# Patient Record
Sex: Male | Born: 1945
Health system: Southern US, Community
[De-identification: ages and names within clinical notes are randomized; demographics above are authoritative.]

---

## 2016-11-28 ENCOUNTER — Other Ambulatory Visit (HOSPITAL_COMMUNITY): Payer: Self-pay

## 2016-11-28 ENCOUNTER — Inpatient Hospital Stay
Admission: RE | Admit: 2016-11-28 | Discharge: 2016-12-26 | Disposition: A | Discharge status: Home Health | Payer: Self-pay | Attending: Internal Medicine | Admitting: Internal Medicine

## 2016-11-28 DIAGNOSIS — J9690 Respiratory failure, unspecified, unspecified whether with hypoxia or hypercapnia: Secondary | ICD-10-CM

## 2016-11-28 DIAGNOSIS — T148XXA Other injury of unspecified body region, initial encounter: Secondary | ICD-10-CM

## 2016-11-28 DIAGNOSIS — L089 Local infection of the skin and subcutaneous tissue, unspecified: Secondary | ICD-10-CM

## 2016-11-28 DIAGNOSIS — K56609 Unspecified intestinal obstruction, unspecified as to partial versus complete obstruction: Secondary | ICD-10-CM

## 2016-11-28 DIAGNOSIS — T8130XA Disruption of wound, unspecified, initial encounter: Secondary | ICD-10-CM

## 2016-11-28 DIAGNOSIS — Z931 Gastrostomy status: Secondary | ICD-10-CM

## 2016-11-28 DIAGNOSIS — K567 Ileus, unspecified: Secondary | ICD-10-CM

## 2016-11-28 LAB — BLOOD GAS, ARTERIAL
Acid-Base Excess: 4.9 mmol/L — ABNORMAL HIGH (ref 0.0–2.0)
Bicarbonate: 30.1 mmol/L — ABNORMAL HIGH (ref 20.0–28.0)
Drawn by: 290171
FIO2: 40
O2 Saturation: 98.8 %
Patient temperature: 98.6
pCO2 arterial: 54.6 mmHg — ABNORMAL HIGH (ref 32.0–48.0)
pH, Arterial: 7.36 (ref 7.350–7.450)
pO2, Arterial: 113 mmHg — ABNORMAL HIGH (ref 83.0–108.0)

## 2016-11-28 MED ORDER — IOPAMIDOL (ISOVUE-300) INJECTION 61%
INTRAVENOUS | Status: AC
  Filled 2016-11-28: qty 50

## 2016-11-29 LAB — CBC
HEMATOCRIT: 31.3 % — AB (ref 39.0–52.0)
Hemoglobin: 9.8 g/dL — ABNORMAL LOW (ref 13.0–17.0)
MCH: 29.1 pg (ref 26.0–34.0)
MCHC: 31.3 g/dL (ref 30.0–36.0)
MCV: 92.9 fL (ref 78.0–100.0)
PLATELETS: 187 10*3/uL (ref 150–400)
RBC: 3.37 MIL/uL — AB (ref 4.22–5.81)
RDW: 16.7 % — AB (ref 11.5–15.5)
WBC: 8.2 10*3/uL (ref 4.0–10.5)

## 2016-11-29 LAB — PROTIME-INR
INR: 1.09
Prothrombin Time: 14.2 seconds (ref 11.4–15.2)

## 2016-11-29 LAB — BASIC METABOLIC PANEL
Anion gap: 10 (ref 5–15)
BUN: 40 mg/dL — AB (ref 6–20)
CO2: 29 mmol/L (ref 22–32)
Calcium: 8.3 mg/dL — ABNORMAL LOW (ref 8.9–10.3)
Chloride: 94 mmol/L — ABNORMAL LOW (ref 101–111)
Creatinine, Ser: 2.74 mg/dL — ABNORMAL HIGH (ref 0.61–1.24)
GFR, EST AFRICAN AMERICAN: 25 mL/min — AB (ref >60)
GFR, EST NON AFRICAN AMERICAN: 22 mL/min — AB (ref >60)
Glucose, Bld: 67 mg/dL (ref 65–99)
POTASSIUM: 3.4 mmol/L — AB (ref 3.5–5.1)
SODIUM: 133 mmol/L — AB (ref 135–145)

## 2016-11-29 LAB — VANCOMYCIN, TROUGH: VANCOMYCIN TR: 27 ug/mL — AB (ref 15–20)

## 2016-11-29 NOTE — Consult Note (Signed)
[facility] CONSULT NOTE    Date: 11/29/2016                  Patient Name:  Henry Parsons  MRN: [id number]  DOB: [birth year]  Age / Sex: 71 y.o., male         PCP: No primary care provider on file.                 Service Requesting Consult: Hospitalist                 Reason for Consult: Evaluation management of acute renal failure             History of Present Illness: Patient is a 71 y.o. male with a PMHx of Coronary artery disease status post recent 3 vessel CABG 10/15/16, tobacco abuse, carotid artery stenosis, hyperlipidemia, history of SVT, COPD, hypertension, abdominal aortic aneurysm who was admitted to [facility]  on 11/28/2016 for ongoing treatment of acute renal failure, acute respiratory failure.  The patient underwent CABG 3 on 10/15/16. Postoperatively he went into atrial fibrillation. He was reintubated on 10/21/2016 secondary to worsening hypoxemia. Unfortunately his respiratory failure persisted and he went back to the OR on 11/14/2016 for tracheostomy. He subsequently had a PEG tube placed as well. Patient was started on dialysis on 11/11/2016. He has been undergoing dialysis on Tuesday, Thursday, Saturday. His last dialysis was on Thursday prior to transfer here. He has a temporary right internal jugular catheter in place.   Medications: Outpatient medications: No prescriptions prior to admission.    Current medications: Current Facility-Administered Medications  Medication Dose Route Frequency Provider Last Rate Last Dose  . iopamidol (ISOVUE-300) 61 % injection               Allergies: No known drug allergies   Past Medical History: Hypertension    History of angioplasty    Carotid artery disease (*)    Dysrhythmia, cardiac    COPD (chronic obstructive pulmonary disease) (*)  USE INHALER DAILY. USE NEB AND INHALER PRN.   Emphysema of lung (*)    Back pain    Anxiety    Arthritis    Coronary artery  disease    Depression    GERD (gastroesophageal reflux disease)    Seizures (*)  "YEARS AGO"  HOH (hard of hearing)    Hyperlipidemia    AAA (abdominal aortic aneurysm) (*)    Callus    Fractures    Myocardial infarction (*)  DR. HARRIS, LAST SEEN 12/07/15 RECEIVED GOOD REPORT PER WIFE 01/16/16  Alcoholism (*)    Stroke (*)  NO DEFICITS PER WIFE 01/16/16  Colon polyp    Family hx colonic polyps  sister, brother  H/O colonoscopy 07/29/14   Cataract    Wears dentures    AKI (acute kidney injury) (*) 11/11/2016   Acute respiratory failure with hypoxia and hypercapnia (*) 11/11/2016   Sepsis (*) 11/11/2016      Past Surgical History: CORONARY ANGIOPLASTY   X 3 PROCEDURES, NO HEART STENT. RIGHT CAROTID STENT 09/14/13  [facility]  VENTRICULAR ABLATION SURGERY     CAROTID ENDARTERECTOMY 2010 Right Dr Hollace [place]  UPPER GASTROINTESTINAL ENDOSCOPY     CAROTID STENT 08/2013 Right   COLONOSCOPY 2006    COLONOSCOPY 07/29/14    TRANSANAL EXCISION RECTAL POLYP 09/13/2014  DR. STANLEY FULLER  HERNIA REPAIR   [facility]     Family History: Family history significant for hypertension in  his brother and sister. The patient's father had history of alcohol abuse. The patient's mother had uterine cancer.  Social History: Patient is married. He has prior history of heavy tobacco abuse. Used to work in [employer].  Review of Systems: Patient cannot offer as he has a tracheostomy in place  Vital Signs: Pulse 77 respirations 10 blood pressure 151/72 pulse ox 93%   Physical Exam: General: NAD, Laying in bed   Head: Normocephalic, atraumatic.  Eyes: Anicteric, EOMI  Nose: Mucous membranes moist, not inflammed, nonerythematous.  Throat: Oropharynx nonerythematous, no exudate appreciated.   Neck: Tracheostomy in place   Lungs:  Scattered rhonchi, normal respiratory effort .  Heart: S1, S2 no pericardial friction rub .  Abdomen:   Soft, nontender, nondistended, PEG tube in place   Extremities: Anasarca noted   Neurologic: Patient awake, alert, and will follow simple commands   Skin: Scattered ecchymosis noted     Lab results: Basic Metabolic Panel:  Recent Labs Lab 11/29/16 0710  NA 133*  K 3.4*  CL 94*  CO2 29  GLUCOSE 67  BUN 40*  CREATININE 2.74*  CALCIUM 8.3*    Liver Function Tests: No results for input(s): AST, ALT, ALKPHOS, BILITOT, PROT, ALBUMIN in the last 168 hours. No results for input(s): LIPASE, AMYLASE in the last 168 hours. No results for input(s): AMMONIA in the last 168 hours.  CBC:  Recent Labs Lab 11/29/16 0710  WBC 8.2  HGB 9.8*  HCT 31.3*  MCV 92.9  PLT 187    Cardiac Enzymes: No results for input(s): CKTOTAL, CKMB, CKMBINDEX, TROPONINI in the last 168 hours.  BNP: Invalid input(s): POCBNP  CBG: No results for input(s): GLUCAP in the last 168 hours.  Microbiology: No results found for this or any previous visit.  Coagulation Studies:  Recent Labs  11/29/16 0710  LABPROT 14.2  INR 1.09    Urinalysis: No results for input(s): COLORURINE, LABSPEC, PHURINE, GLUCOSEU, HGBUR, BILIRUBINUR, KETONESUR, PROTEINUR, UROBILINOGEN, NITRITE, LEUKOCYTESUR in the last 72 hours.  Invalid input(s): APPERANCEUR    Imaging: Dg Abd Portable 1v  Result Date: 11/28/2016 CLINICAL DATA:  Peg tube placement EXAM: PORTABLE ABDOMEN - 1 VIEW COMPARISON:  None. FINDINGS: Post sternotomy changes. Left basilar atelectasis or infiltrate. Small amount of contrast within collapsed stomach, more focal contrast collection at the gastroduodenal junction with contrast present in the duodenum. Some puddling of contrast at the gastroduodenal junction. IMPRESSION: 1. Apparent gastro jejunostomy tube with small amounts of contrast present in the stomach and duodenum. Globular contrast at the distal stomach presumably represents contrast around the balloon which appears positioned at the  gastroduodenal junction. There is some puddling of contrast peripheral to this, possibly around the balloon. Contrast injection with direct fluoroscopic observation would be helpful to clarify. Electronically Signed   By: Jasmine Pang M.D.   On: 11/28/2016 22:35      Assessment & Plan: Pt is a 71 y.o. male with a PMHx of Coronary artery disease status post recent 3 vessel CABG 10/15/16, tobacco abuse, carotid artery stenosis, hyperlipidemia, history of SVT, COPD, hypertension, abdominal aortic aneurysm who was admitted to [facility]  on 11/28/2016 for ongoing treatment of acute renal failure, acute respiratory failure.  1. Acute renal failure, likely multifactorial.  Baseline creatinine 1.1.  As above suspect that the patient's acute renal failure is multifactorial. He underwent recent CABG. We will plan for dialysis again tomorrow. Hopefully his renal function will improve over time. We will continue to use the temporary  right internal jugular catheter. If it appears that his dialysis will be prolonged we may need to consider placing a PermCath.  2.  Acute respiratory failure. Patient has a tracheostomy in place. He is not currently on the ventilator. Recommend [place]/critical care consultation.  3. Anemia unspecified. Hemoglobin currently 9.8. Hold off on Epogen at this time. Continue to monitor CBC.  4. Hyponatremia. Serum sodium mildly low at 133. This should be corrected with the next dialysis treatment. Continue to monitor serum electrolytes.  5. Thanks for consultation.

## 2016-11-30 LAB — CBC
HCT: 32.2 % — ABNORMAL LOW (ref 39.0–52.0)
Hemoglobin: 10.2 g/dL — ABNORMAL LOW (ref 13.0–17.0)
MCH: 29.7 pg (ref 26.0–34.0)
MCHC: 31.7 g/dL (ref 30.0–36.0)
MCV: 93.9 fL (ref 78.0–100.0)
PLATELETS: 194 10*3/uL (ref 150–400)
RBC: 3.43 MIL/uL — ABNORMAL LOW (ref 4.22–5.81)
RDW: 17 % — AB (ref 11.5–15.5)
WBC: 10.1 10*3/uL (ref 4.0–10.5)

## 2016-11-30 LAB — RENAL FUNCTION PANEL
Albumin: 2.4 g/dL — ABNORMAL LOW (ref 3.5–5.0)
Anion gap: 9 (ref 5–15)
BUN: 46 mg/dL — AB (ref 6–20)
CHLORIDE: 93 mmol/L — AB (ref 101–111)
CO2: 29 mmol/L (ref 22–32)
CREATININE: 3.01 mg/dL — AB (ref 0.61–1.24)
Calcium: 8.2 mg/dL — ABNORMAL LOW (ref 8.9–10.3)
GFR calc Af Amer: 22 mL/min — ABNORMAL LOW (ref >60)
GFR calc non Af Amer: 19 mL/min — ABNORMAL LOW (ref >60)
GLUCOSE: 109 mg/dL — AB (ref 65–99)
Phosphorus: 5.5 mg/dL — ABNORMAL HIGH (ref 2.5–4.6)
Potassium: 3.3 mmol/L — ABNORMAL LOW (ref 3.5–5.1)
Sodium: 131 mmol/L — ABNORMAL LOW (ref 135–145)

## 2016-11-30 LAB — CK TOTAL AND CKMB (NOT AT ARMC)
CK, MB: 14.2 ng/mL — ABNORMAL HIGH (ref 0.5–5.0)
Relative Index: INVALID (ref 0.0–2.5)
Total CK: 81 U/L (ref 49–397)

## 2016-11-30 LAB — TROPONIN I: Troponin I: 0.08 ng/mL (ref <0.03)

## 2016-12-01 LAB — CK TOTAL AND CKMB (NOT AT ARMC)
CK, MB: UNDETERMINED ng/mL (ref 0.5–5.0)
RELATIVE INDEX: UNDETERMINED (ref 0.0–2.5)
Total CK: UNDETERMINED U/L (ref 49–397)

## 2016-12-01 LAB — POTASSIUM: Potassium: 4.7 mmol/L (ref 3.5–5.1)

## 2016-12-01 LAB — TROPONIN I: TROPONIN I: 0.08 ng/mL — AB (ref <0.03)

## 2016-12-02 LAB — HEPATITIS B SURFACE ANTIGEN: Hepatitis B Surface Ag: NEGATIVE

## 2016-12-02 NOTE — Progress Notes (Signed)
[facility]    Date: 12/02/2016                  Patient Name:  Henry Parsons  MRN: [id number]  DOB: 08/26/1945  Age / Sex: 71 y.o., male          History of Present Illness: Patient is a 71 y.o. male with a PMHx of Coronary artery disease status post recent 3 vessel CABG 10/15/16, tobacco abuse, carotid artery stenosis, hyperlipidemia, history of SVT, COPD, hypertension, abdominal aortic aneurysm who was admitted to [facility]  on 11/28/2016 for ongoing treatment of acute renal failure, acute respiratory failure.  The patient underwent CABG 3 on 10/15/16. Postoperatively he went into atrial fibrillation. He was reintubated on 10/21/2016 secondary to worsening hypoxemia. Unfortunately his respiratory failure persisted and he went back to the OR on 11/14/2016 for tracheostomy. He subsequently had a PEG tube placed as well. Patient was started on dialysis on 11/11/2016.   Urine output 900 cc required last 24 hours Patient remains lethargic but able to follow simple commands. Wife is at bedside. Tube feeds are Continued via PEG tube Continues to have large amount of edema   Vital Signs: Temperature 97.4, pulse 71, respirations 18, blood pressure 106/59   Physical Exam: General: NAD, Laying in bed   Head: Normocephalic, atraumatic.  Eyes: Anicteric,   Nose: Mucous membranes moist, not inflammed, nonerythematous.  Throat: Oropharynx nonerythematous, no exudate appreciated.   Neck: Tracheostomy in place   Lungs:  Scattered rhonchi, normal respiratory effort .  Heart: S1, S2 no pericardial friction rub .  Abdomen:  Soft, nontender, nondistended, PEG tube in place   Extremities: Anasarca noted   Neurologic: Patient  follows simple commands   Skin: Scattered ecchymosis noted   Right IJ temporary dialysis catheter  Lab results: Basic Metabolic Panel:  Recent Labs Lab 11/29/16 0710 11/30/16 0640 12/01/16 0722  NA 133* 131*  --   K 3.4* 3.3* 4.7  CL 94*  93*  --   CO2 29 29  --   GLUCOSE 67 109*  --   BUN 40* 46*  --   CREATININE 2.74* 3.01*  --   CALCIUM 8.3* 8.2*  --   PHOS  --  5.5*  --     Liver Function Tests:  Recent Labs Lab 11/30/16 0640  ALBUMIN 2.4*   No results for input(s): LIPASE, AMYLASE in the last 168 hours. No results for input(s): AMMONIA in the last 168 hours.  CBC:  Recent Labs Lab 11/29/16 0710 11/30/16 0640  WBC 8.2 10.1  HGB 9.8* 10.2*  HCT 31.3* 32.2*  MCV 92.9 93.9  PLT 187 194    Cardiac Enzymes:  Recent Labs Lab 11/30/16 0748 12/01/16 0722  CKTOTAL 81 QUANTITY NOT SUFFICIENT, UNABLE TO PERFORM TEST  CKMB 14.2* QUANTITY NOT SUFFICIENT, UNABLE TO PERFORM TEST  TROPONINI 0.08* 0.08*    BNP: Invalid input(s): POCBNP  CBG: No results for input(s): GLUCAP in the last 168 hours.  Microbiology: No results found for this or any previous visit.  Coagulation Studies: No results for input(s): LABPROT, INR in the last 72 hours.  Urinalysis: No results for input(s): COLORURINE, LABSPEC, PHURINE, GLUCOSEU, HGBUR, BILIRUBINUR, KETONESUR, PROTEINUR, UROBILINOGEN, NITRITE, LEUKOCYTESUR in the last 72 hours.  Invalid input(s): APPERANCEUR    Imaging: No results found.   Assessment & Plan: Pt is a 71 y.o. male with a PMHx of Coronary artery disease status post recent 3 vessel CABG 10/15/16, tobacco abuse, carotid artery  stenosis, hyperlipidemia, history of SVT, COPD, hypertension, abdominal aortic aneurysm who was admitted to [facility]  on 11/28/2016 for ongoing treatment of acute renal failure, acute respiratory failure.  1. Acute renal failure, likely multifactorial.  Baseline creatinine 1.1.  As above suspect that the patient's acute renal failure is multifactorial. He underwent recent CABG.  We will plan for dialysis again tomorrow. Hopefully his renal function will improve over time. We will continue to use the temporary right internal jugular catheter. If it appears that his  dialysis will be prolonged we may need to consider placing a PermCath. Add Lasix drip and IV albumin  2.  Acute respiratory failure. Patient has a tracheostomy in place. He is not currently on the ventilator.   3. Anasarca/volume overload Urine output appears to be starting to improve. Fluid removal with dialysis, IV Lasix, IV albumin

## 2016-12-03 ENCOUNTER — Other Ambulatory Visit (HOSPITAL_COMMUNITY): Payer: Self-pay

## 2016-12-03 LAB — CBC
HCT: 27 % — ABNORMAL LOW (ref 39.0–52.0)
HCT: 27.6 % — ABNORMAL LOW (ref 39.0–52.0)
HEMATOCRIT: 25.9 % — AB (ref 39.0–52.0)
HEMOGLOBIN: 8.5 g/dL — AB (ref 13.0–17.0)
HEMOGLOBIN: 8.4 g/dL — AB (ref 13.0–17.0)
HEMOGLOBIN: 8.8 g/dL — AB (ref 13.0–17.0)
MCH: 29 pg (ref 26.0–34.0)
MCH: 29.8 pg (ref 26.0–34.0)
MCH: 29.6 pg (ref 26.0–34.0)
MCHC: 31.5 g/dL (ref 30.0–36.0)
MCHC: 32.4 g/dL (ref 30.0–36.0)
MCHC: 31.9 g/dL (ref 30.0–36.0)
MCV: 92.2 fL (ref 78.0–100.0)
MCV: 91.8 fL (ref 78.0–100.0)
MCV: 92.9 fL (ref 78.0–100.0)
Platelets: 226 10*3/uL (ref 150–400)
Platelets: 207 10*3/uL (ref 150–400)
Platelets: 223 10*3/uL (ref 150–400)
RBC: 2.93 MIL/uL — AB (ref 4.22–5.81)
RBC: 2.82 MIL/uL — ABNORMAL LOW (ref 4.22–5.81)
RBC: 2.97 MIL/uL — AB (ref 4.22–5.81)
RDW: 17.8 % — ABNORMAL HIGH (ref 11.5–15.5)
RDW: 18.1 % — ABNORMAL HIGH (ref 11.5–15.5)
RDW: 18.4 % — ABNORMAL HIGH (ref 11.5–15.5)
WBC: 10.6 10*3/uL — ABNORMAL HIGH (ref 4.0–10.5)
WBC: 9.8 10*3/uL (ref 4.0–10.5)
WBC: 9.8 10*3/uL (ref 4.0–10.5)

## 2016-12-03 LAB — BLOOD GAS, ARTERIAL
ACID-BASE EXCESS: 4.3 mmol/L — AB (ref 0.0–2.0)
Acid-Base Excess: 4.5 mmol/L — ABNORMAL HIGH (ref 0.0–2.0)
BICARBONATE: 29.4 mmol/L — AB (ref 20.0–28.0)
Bicarbonate: 29.2 mmol/L — ABNORMAL HIGH (ref 20.0–28.0)
FIO2: 40
FIO2: 0.4
O2 Saturation: 91.7 %
O2 Saturation: 97.7 %
PATIENT TEMPERATURE: 97.4
PATIENT TEMPERATURE: 98.6
PCO2 ART: 50 mmHg — AB (ref 32.0–48.0)
PEEP: 5 cmH2O
PH ART: 7.369 (ref 7.350–7.450)
RATE: 16 resp/min
VT: 500 mL
pCO2 arterial: 51.7 mmHg — ABNORMAL HIGH (ref 32.0–48.0)
pH, Arterial: 7.385 (ref 7.350–7.450)
pO2, Arterial: 59.2 mmHg — ABNORMAL LOW (ref 83.0–108.0)
pO2, Arterial: 96.2 mmHg (ref 83.0–108.0)

## 2016-12-03 LAB — BASIC METABOLIC PANEL
ANION GAP: 9 (ref 5–15)
BUN: 32 mg/dL — ABNORMAL HIGH (ref 6–20)
CALCIUM: 8.3 mg/dL — AB (ref 8.9–10.3)
CO2: 26 mmol/L (ref 22–32)
Chloride: 98 mmol/L — ABNORMAL LOW (ref 101–111)
Creatinine, Ser: 2.43 mg/dL — ABNORMAL HIGH (ref 0.61–1.24)
GFR, EST AFRICAN AMERICAN: 29 mL/min — AB (ref >60)
GFR, EST NON AFRICAN AMERICAN: 25 mL/min — AB (ref >60)
Glucose, Bld: 98 mg/dL (ref 65–99)
Potassium: 3.3 mmol/L — ABNORMAL LOW (ref 3.5–5.1)
Sodium: 133 mmol/L — ABNORMAL LOW (ref 135–145)

## 2016-12-03 LAB — RENAL FUNCTION PANEL
ALBUMIN: 2 g/dL — AB (ref 3.5–5.0)
ANION GAP: 8 (ref 5–15)
BUN: 50 mg/dL — ABNORMAL HIGH (ref 6–20)
CALCIUM: 8.2 mg/dL — AB (ref 8.9–10.3)
CO2: 29 mmol/L (ref 22–32)
Chloride: 98 mmol/L — ABNORMAL LOW (ref 101–111)
Creatinine, Ser: 3.18 mg/dL — ABNORMAL HIGH (ref 0.61–1.24)
GFR, EST AFRICAN AMERICAN: 21 mL/min — AB (ref >60)
GFR, EST NON AFRICAN AMERICAN: 18 mL/min — AB (ref >60)
Glucose, Bld: 99 mg/dL (ref 65–99)
PHOSPHORUS: 4.4 mg/dL (ref 2.5–4.6)
Potassium: 3.1 mmol/L — ABNORMAL LOW (ref 3.5–5.1)
SODIUM: 135 mmol/L (ref 135–145)

## 2016-12-03 LAB — HEPATITIS B CORE ANTIBODY, TOTAL: HEP B C TOTAL AB: NEGATIVE

## 2016-12-03 LAB — TROPONIN I: TROPONIN I: 0.13 ng/mL — AB (ref <0.03)

## 2016-12-03 LAB — HEPATITIS B SURFACE ANTIBODY,QUALITATIVE: HEP B S AB: REACTIVE

## 2016-12-04 NOTE — Progress Notes (Signed)
[facility]    Date: 12/04/2016                  Patient Name:  Henry Parsons  MRN: [id number]  DOB: [birth year]  Age / Sex: 71 y.o., male          History of Present Illness: Patient is a 71 y.o. male with a PMHx of Coronary artery disease status post recent 3 vessel CABG 10/15/16, tobacco abuse, carotid artery stenosis, hyperlipidemia, history of SVT, COPD, hypertension, abdominal aortic aneurysm who was admitted to [facility]  on 11/28/2016 for ongoing treatment of acute renal failure, acute respiratory failure.  The patient underwent CABG 3 on 10/15/16. Postoperatively he went into atrial fibrillation. He was reintubated on 10/21/2016 secondary to worsening hypoxemia. Unfortunately his respiratory failure persisted and he went back to the OR on 11/14/2016 for tracheostomy. He subsequently had a PEG tube placed as well. Patient was started on dialysis on 11/11/2016.   Urine output 875 cc  last 24 hours Patient remains lethargic today. Resting quietly/ Wife is at bedside. Tube feeds are Continued via PEG tube Continues to have large amount of edema BP dropped yesterday post HD; required 500 cc bolus Lasix drip is continued at 6 mg/hr   Vital Signs: Temperature 98.4, pulse 71, respirations 18, blood pressure 95/46   Physical Exam: General: NAD, Laying in bed   Head: Normocephalic, atraumatic.  Eyes: Anicteric,   Nose: Mucous membranes moist,   Throat: Oropharynx nonerythematous, no exudate appreciated.   Neck: Tracheostomy in place   Lungs:  Scattered rhonchi, normal respiratory effort .  Heart: S1, S2 no pericardial friction rub .  Abdomen:  Soft, nontender, nondistended, PEG tube in place   Extremities: Anasarca noted   Neurologic: Patient  Resting quietly  Skin: Scattered ecchymosis noted   Right IJ temporary dialysis catheter  Lab results: Basic Metabolic Panel:  Recent Labs Lab 11/30/16 0640 12/01/16 0722 12/03/16 0830 12/03/16 1807  NA  131*  --  135 133*  K 3.3* 4.7 3.1* 3.3*  CL 93*  --  98* 98*  CO2 29  --  29 26  GLUCOSE 109*  --  99 98  BUN 46*  --  50* 32*  CREATININE 3.01*  --  3.18* 2.43*  CALCIUM 8.2*  --  8.2* 8.3*  PHOS 5.5*  --  4.4  --     Liver Function Tests:  Recent Labs Lab 11/30/16 0640 12/03/16 0830  ALBUMIN 2.4* 2.0*   No results for input(s): LIPASE, AMYLASE in the last 168 hours. No results for input(s): AMMONIA in the last 168 hours.  CBC:  Recent Labs Lab 11/29/16 0710 11/30/16 0640 12/03/16 0830 12/03/16 1553 12/03/16 1807  WBC 8.2 10.1 10.6* 9.8 9.8  HGB 9.8* 10.2* 8.5* 8.4* 8.8*  HCT 31.3* 32.2* 27.0* 25.9* 27.6*  MCV 92.9 93.9 92.2 91.8 92.9  PLT 187 194 226 207 223    Cardiac Enzymes:  Recent Labs Lab 11/30/16 0748 12/01/16 0722 12/03/16 1807  CKTOTAL 81 QUANTITY NOT SUFFICIENT, UNABLE TO PERFORM TEST  --   CKMB 14.2* QUANTITY NOT SUFFICIENT, UNABLE TO PERFORM TEST  --   TROPONINI 0.08* 0.08* 0.13*    BNP: Invalid input(s): POCBNP  CBG: No results for input(s): GLUCAP in the last 168 hours.  Microbiology: No results found for this or any previous visit.  Coagulation Studies: No results for input(s): LABPROT, INR in the last 72 hours.  Urinalysis: No results for input(s): COLORURINE, LABSPEC, PHURINE,  GLUCOSEU, HGBUR, BILIRUBINUR, KETONESUR, PROTEINUR, UROBILINOGEN, NITRITE, LEUKOCYTESUR in the last 72 hours.  Invalid input(s): APPERANCEUR    Imaging: Dg Chest Port 1 View  Result Date: 12/03/2016 CLINICAL DATA:  71 year old male with shortness breath. Initial encounter. EXAM: PORTABLE CHEST 1 VIEW COMPARISON:  None. FINDINGS: Tracheostomy tube tip midline. Right central line tip proximal superior vena cava level. Post CABG.  Cardiomegaly. Diffuse airspace disease suggestive of pulmonary edema with pleural effusions. Basilar atelectasis suspected.  Infiltrate not excluded. Shoulder joint degenerative change greater on left. Calcified aorta. No gross  pneumothorax. IMPRESSION: Cardiomegaly with pulmonary edema and bilateral pleural effusions. Basilar atelectasis. Tracheostomy tube tip midline. Aortic atherosclerosis. Electronically Signed   By: Lacy DuverneySteven  Olson M.D.   On: 12/03/2016 07:34     Assessment & Plan: Pt is a 71 y.o. male with a PMHx of Coronary artery disease status post recent 3 vessel CABG 10/15/16, tobacco abuse, carotid artery stenosis, hyperlipidemia, history of SVT, COPD, hypertension, abdominal aortic aneurysm who was admitted to [facility]  on 11/28/2016 for ongoing treatment of acute renal failure, acute respiratory failure.  1. Acute renal failure, likely multifactorial.  Baseline creatinine 1.1.  As above suspect that the patient's acute renal failure is multifactorial. He underwent recent CABG.  Electrolytes are acceptable; UOP appears to have improved Continue lasix and albumin  - consider decreasing dose of Metoprolol as BP is low  2.  Acute respiratory failure. Patient has a tracheostomy in place.  Vent assisted; at present   3. Anasarca/volume overload Urine output appears to be starting to improve. Diuresis with IV Lasix, IV albumin  4. Hypokalemia Replace PRN

## 2016-12-05 LAB — C DIFFICILE QUICK SCREEN W PCR REFLEX
C DIFFICILE (CDIFF) TOXIN: NEGATIVE
C DIFFICLE (CDIFF) ANTIGEN: NEGATIVE
C Diff interpretation: NOT DETECTED

## 2016-12-06 LAB — CBC WITH DIFFERENTIAL/PLATELET
BASOS ABS: 0.1 10*3/uL (ref 0.0–0.1)
Basophils Relative: 1 %
EOS ABS: 0.1 10*3/uL (ref 0.0–0.7)
Eosinophils Relative: 1 %
HEMATOCRIT: 25.2 % — AB (ref 39.0–52.0)
Hemoglobin: 7.9 g/dL — ABNORMAL LOW (ref 13.0–17.0)
LYMPHS ABS: 1.2 10*3/uL (ref 0.7–4.0)
LYMPHS PCT: 15 %
MCH: 28.7 pg (ref 26.0–34.0)
MCHC: 31.3 g/dL (ref 30.0–36.0)
MCV: 91.6 fL (ref 78.0–100.0)
MONOS PCT: 11 %
Monocytes Absolute: 0.9 10*3/uL (ref 0.1–1.0)
NEUTROS ABS: 5.8 10*3/uL (ref 1.7–7.7)
Neutrophils Relative %: 72 %
Platelets: 249 10*3/uL (ref 150–400)
RBC: 2.75 MIL/uL — AB (ref 4.22–5.81)
RDW: 17.7 % — AB (ref 11.5–15.5)
WBC: 8.1 10*3/uL (ref 4.0–10.5)

## 2016-12-06 LAB — BASIC METABOLIC PANEL
ANION GAP: 12 (ref 5–15)
BUN: 58 mg/dL — AB (ref 6–20)
CALCIUM: 8.3 mg/dL — AB (ref 8.9–10.3)
CO2: 23 mmol/L (ref 22–32)
Chloride: 100 mmol/L — ABNORMAL LOW (ref 101–111)
Creatinine, Ser: 3.44 mg/dL — ABNORMAL HIGH (ref 0.61–1.24)
GFR calc Af Amer: 19 mL/min — ABNORMAL LOW (ref >60)
GFR, EST NON AFRICAN AMERICAN: 16 mL/min — AB (ref >60)
GLUCOSE: 90 mg/dL (ref 65–99)
POTASSIUM: 3 mmol/L — AB (ref 3.5–5.1)
SODIUM: 135 mmol/L (ref 135–145)

## 2016-12-06 NOTE — Progress Notes (Signed)
[facility]    Date: 12/06/2016                  Patient Name:  Henry Parsons  MRN: [id number]  DOB: 1945/11/06  Age / Sex: 71 y.o., male          History of Present Illness: Patient is a 71 y.o. male with a PMHx of Coronary artery disease status post recent 3 vessel CABG 10/15/16, tobacco abuse, carotid artery stenosis, hyperlipidemia, history of SVT, COPD, hypertension, abdominal aortic aneurysm who was admitted to [facility]  on 11/28/2016 for ongoing treatment of acute renal failure, acute respiratory failure.  The patient underwent CABG 3 on 10/15/16. Postoperatively he went into atrial fibrillation. He was reintubated on 10/21/2016 secondary to worsening hypoxemia. Unfortunately his respiratory failure persisted and he went back to the OR on 11/14/2016 for tracheostomy. He subsequently had a PEG tube placed as well. Patient was started on dialysis on 11/11/2016.   Urine output 1375 cc  last 24 hours Patient is alert today. Mouthing words. Able to follow commands. Wife is at bedside. Tube feeds are Continued via PEG tube Continues to have large amount of edema, but that appears to be improving Lasix drip is continued   Remains ventilator dependent. FiO2 28%, PEEP 5.   Vital Signs: Temperature Temperature 97.6, pulse 77, respirations 17, blood pressure 124/57   Physical Exam: General: NAD, Laying in bed   Head: Normocephalic, atraumatic.  Eyes: Anicteric,   Nose: Mucous membranes moist,   Throat: Oropharynx nonerythematous, no exudate appreciated.   Neck: Tracheostomy in place   Lungs:  Scattered rhonchi, Ventilator assisted   Heart: S1, S2 no pericardial friction rub .  Abdomen:  Soft, nontender, nondistended, PEG tube in place   Extremities: Anasarca noted   Neurologic: Patient  Resting quietly  Skin: Scattered ecchymosis noted   Right IJ temporary dialysis catheter  Lab results: Basic Metabolic Panel:  Recent Labs Lab 11/30/16 0640   12/03/16 0830 12/03/16 1807 12/06/16 0542  NA 131*  --  135 133* 135  K 3.3*  < > 3.1* 3.3* 3.0*  CL 93*  --  98* 98* 100*  CO2 29  --  29 26 23   GLUCOSE 109*  --  99 98 90  BUN 46*  --  50* 32* 58*  CREATININE 3.01*  --  3.18* 2.43* 3.44*  CALCIUM 8.2*  --  8.2* 8.3* 8.3*  PHOS 5.5*  --  4.4  --   --   < > = values in this interval not displayed.  Liver Function Tests:  Recent Labs Lab 11/30/16 0640 12/03/16 0830  ALBUMIN 2.4* 2.0*   No results for input(s): LIPASE, AMYLASE in the last 168 hours. No results for input(s): AMMONIA in the last 168 hours.  CBC:  Recent Labs Lab 11/30/16 0640 12/03/16 0830 12/03/16 1553 12/03/16 1807 12/06/16 1032  WBC 10.1 10.6* 9.8 9.8 8.1  NEUTROABS  --   --   --   --  5.8  HGB 10.2* 8.5* 8.4* 8.8* 7.9*  HCT 32.2* 27.0* 25.9* 27.6* 25.2*  MCV 93.9 92.2 91.8 92.9 91.6  PLT 194 226 207 223 249    Cardiac Enzymes:  Recent Labs Lab 11/30/16 0748 12/01/16 0722 12/03/16 1807  CKTOTAL 81 QUANTITY NOT SUFFICIENT, UNABLE TO PERFORM TEST  --   CKMB 14.2* QUANTITY NOT SUFFICIENT, UNABLE TO PERFORM TEST  --   TROPONINI 0.08* 0.08* 0.13*    BNP: Invalid input(s): POCBNP  CBG:  No results for input(s): GLUCAP in the last 168 hours.  Microbiology: Results for orders placed or performed during the hospital encounter of 11/28/16  C difficile quick scan w PCR reflex     Status: None   Collection Time: 12/05/16  8:45 AM  Result Value Ref Range Status   C Diff antigen NEGATIVE NEGATIVE Final   C Diff toxin NEGATIVE NEGATIVE Final   C Diff interpretation No C. difficile detected.  Final  Culture, respiratory (NON-Expectorated)     Status: None (Preliminary result)   Collection Time: 12/05/16  3:23 PM  Result Value Ref Range Status   Specimen Description TRACHEAL ASPIRATE  Final   Special Requests NONE  Final   Gram Stain   Final    RARE WBC PRESENT, PREDOMINANTLY MONONUCLEAR RARE BUDDING YEAST SEEN RARE GRAM NEGATIVE RODS     Culture FEW PSEUDOMONAS AERUGINOSA  Final   Report Status PENDING  Incomplete    Coagulation Studies: No results for input(s): LABPROT, INR in the last 72 hours.  Urinalysis: No results for input(s): COLORURINE, LABSPEC, PHURINE, GLUCOSEU, HGBUR, BILIRUBINUR, KETONESUR, PROTEINUR, UROBILINOGEN, NITRITE, LEUKOCYTESUR in the last 72 hours.  Invalid input(s): APPERANCEUR    Imaging: No results found.   Assessment & Plan: Pt is a 71 y.o. male with a PMHx of Coronary artery disease status post recent 3 vessel CABG 10/15/16, tobacco abuse, carotid artery stenosis, hyperlipidemia, history of SVT, COPD, hypertension, abdominal aortic aneurysm who was admitted to [facility]  on 11/28/2016 for ongoing treatment of acute renal failure, acute respiratory failure.  1. Acute renal failure, likely multifactorial.  Baseline creatinine 1.1.  As above suspect that the patient's acute renal failure is multifactorial. He underwent recent CABG.  Electrolytes are acceptable; UOP appears to have improved Continue lasix and albumin  - Continue to hold dialysis for now. Last dialysis was Monday July 16. Reassess after the weekend. 2.  Acute respiratory failure. Patient has a tracheostomy in place.  Vent assisted; at present   3. Anasarca/volume overload Urine output appears to be starting to improve. Diuresis with IV Lasix, IV albumin  4. Hypokalemia Replace PRN Add spironolactone 25 mg daily

## 2016-12-07 LAB — CULTURE, RESPIRATORY W GRAM STAIN

## 2016-12-07 LAB — CULTURE, RESPIRATORY

## 2016-12-09 LAB — CBC
HCT: 26.2 % — ABNORMAL LOW (ref 39.0–52.0)
HEMOGLOBIN: 8.2 g/dL — AB (ref 13.0–17.0)
MCH: 28 pg (ref 26.0–34.0)
MCHC: 31.3 g/dL (ref 30.0–36.0)
MCV: 89.4 fL (ref 78.0–100.0)
PLATELETS: 272 10*3/uL (ref 150–400)
RBC: 2.93 MIL/uL — AB (ref 4.22–5.81)
RDW: 17.6 % — ABNORMAL HIGH (ref 11.5–15.5)
WBC: 8.3 10*3/uL (ref 4.0–10.5)

## 2016-12-09 LAB — BASIC METABOLIC PANEL
Anion gap: 11 (ref 5–15)
BUN: 77 mg/dL — AB (ref 6–20)
CHLORIDE: 96 mmol/L — AB (ref 101–111)
CO2: 30 mmol/L (ref 22–32)
CREATININE: 3.91 mg/dL — AB (ref 0.61–1.24)
Calcium: 8.3 mg/dL — ABNORMAL LOW (ref 8.9–10.3)
GFR, EST AFRICAN AMERICAN: 16 mL/min — AB (ref >60)
GFR, EST NON AFRICAN AMERICAN: 14 mL/min — AB (ref >60)
Glucose, Bld: 100 mg/dL — ABNORMAL HIGH (ref 65–99)
POTASSIUM: 2.3 mmol/L — AB (ref 3.5–5.1)
SODIUM: 137 mmol/L (ref 135–145)

## 2016-12-09 NOTE — Progress Notes (Signed)
[facility]  ROUNDING NOTE   Subjective:  Patient seen at bedside. resting comfortably in bed. Tracheostomy in place. Renal function worsening As BUN is up to 77 with a creatinine of 3.9.  Objective:  Vital signs in last 24 hours:  Temperature 97.2 pulse 78 respirations 16 blood pressure 126/74 Physical Exam: General: No acute distress  Head: Normocephalic, atraumatic. Moist oral mucosal membranes  Eyes: Anicteric  Neck: Tracheostomy in place   Lungs:  Scattered rhonchi, unlabored breathing   Heart: S1S2 no rubs  Abdomen:  Soft, nontender, bowel sounds present  Extremities: 1+ peripheral edema.  Neurologic: Awake, alert, following commands  Skin: Scattered ecchymoses   Access: Right IJ temporary dialysis catheter     Basic Metabolic Panel:  Recent Labs Lab 12/03/16 0830 12/03/16 1807 12/06/16 0542 12/09/16 0544  NA 135 133* 135 137  K 3.1* 3.3* 3.0* 2.3*  CL 98* 98* 100* 96*  CO2 29 26 23 30   GLUCOSE 99 98 90 100*  BUN 50* 32* 58* 77*  CREATININE 3.18* 2.43* 3.44* 3.91*  CALCIUM 8.2* 8.3* 8.3* 8.3*  PHOS 4.4  --   --   --     Liver Function Tests:  Recent Labs Lab 12/03/16 0830  ALBUMIN 2.0*   No results for input(s): LIPASE, AMYLASE in the last 168 hours. No results for input(s): AMMONIA in the last 168 hours.  CBC:  Recent Labs Lab 12/03/16 0830 12/03/16 1553 12/03/16 1807 12/06/16 1032 12/09/16 0544  WBC 10.6* 9.8 9.8 8.1 8.3  NEUTROABS  --   --   --  5.8  --   HGB 8.5* 8.4* 8.8* 7.9* 8.2*  HCT 27.0* 25.9* 27.6* 25.2* 26.2*  MCV 92.2 91.8 92.9 91.6 89.4  PLT 226 207 223 249 272    Cardiac Enzymes:  Recent Labs Lab 12/03/16 1807  TROPONINI 0.13*    BNP: Invalid input(s): POCBNP  CBG: No results for input(s): GLUCAP in the last 168 hours.  Microbiology: Results for orders placed or performed during the hospital encounter of 11/28/16  C difficile quick scan w PCR reflex     Status: None   Collection Time: 12/05/16   8:45 AM  Result Value Ref Range Status   C Diff antigen NEGATIVE NEGATIVE Final   C Diff toxin NEGATIVE NEGATIVE Final   C Diff interpretation No C. difficile detected.  Final  Culture, respiratory (NON-Expectorated)     Status: None   Collection Time: 12/05/16  3:23 PM  Result Value Ref Range Status   Specimen Description TRACHEAL ASPIRATE  Final   Special Requests NONE  Final   Gram Stain   Final    RARE WBC PRESENT, PREDOMINANTLY MONONUCLEAR RARE BUDDING YEAST SEEN RARE GRAM NEGATIVE RODS    Culture   Final    FEW PSEUDOMONAS AERUGINOSA MULTI-DRUG RESISTANT ORGANISM RESULT CALLED TO, READ BACK BY AND VERIFIED WITH: S ODELL,RN AT 1015 12/07/16 BY L BENFIELD    Report Status 12/07/2016 FINAL  Final   Organism ID, Bacteria PSEUDOMONAS AERUGINOSA  Final      Susceptibility   Pseudomonas aeruginosa - MIC*    CEFTAZIDIME >=64 RESISTANT Resistant     CIPROFLOXACIN >=4 RESISTANT Resistant     GENTAMICIN 4 SENSITIVE Sensitive     IMIPENEM >=16 RESISTANT Resistant     CEFEPIME >=64 RESISTANT Resistant     * FEW PSEUDOMONAS AERUGINOSA    Coagulation Studies: No results for input(s): LABPROT, INR in the last 72 hours.  Urinalysis: No results for  input(s): COLORURINE, LABSPEC, PHURINE, GLUCOSEU, HGBUR, BILIRUBINUR, KETONESUR, PROTEINUR, UROBILINOGEN, NITRITE, LEUKOCYTESUR in the last 72 hours.  Invalid input(s): APPERANCEUR    Imaging: No results found.   Medications:       Assessment/ Plan:  71 y.o. male with a PMHx of Coronary artery disease status post recent 3 vessel CABG 10/15/16, tobacco abuse, carotid artery stenosis, hyperlipidemia, history of SVT, COPD, hypertension, abdominal aortic aneurysm who was admitted to [place]  on 11/28/2016 for ongoing treatment of acute renal failure, acute respiratory failure.  1. Acute renal failure, likely multifactorial.  CKD stage II  Baseline creatinine 1.1, EGFR 67..  As above suspect that the patient's acute renal  failure is multifactorial. He underwent recent CABG.  -  Renal parameters are worsening. However patient has good urine output. No urgent indication for dialysis however if azotemia worsens we may need to consider reinitiation of dialysis.  2.  Acute respiratory failure. Patient doing well with tracheostomy in place. Off the ventilator at the moment.  3. Anasarca/volume overload.  Good urine output noted. Continue Lasix.  4. Anemia of chronic kidney disease. Hemoglobin currently 8.2. Continue to monitor CBC. Consider transfusion for hemoglobin of 7 or less.   LOS: 0 Henry Parsons 7/23/20183:18 PM

## 2016-12-10 ENCOUNTER — Other Ambulatory Visit (HOSPITAL_COMMUNITY): Payer: Self-pay

## 2016-12-10 LAB — POTASSIUM
POTASSIUM: 2.5 mmol/L — AB (ref 3.5–5.1)
Potassium: 3.4 mmol/L — ABNORMAL LOW (ref 3.5–5.1)

## 2016-12-11 LAB — CBC
HCT: 24.8 % — ABNORMAL LOW (ref 39.0–52.0)
Hemoglobin: 7.8 g/dL — ABNORMAL LOW (ref 13.0–17.0)
MCH: 28.5 pg (ref 26.0–34.0)
MCHC: 31.5 g/dL (ref 30.0–36.0)
MCV: 90.5 fL (ref 78.0–100.0)
PLATELETS: 297 10*3/uL (ref 150–400)
RBC: 2.74 MIL/uL — ABNORMAL LOW (ref 4.22–5.81)
RDW: 18 % — AB (ref 11.5–15.5)
WBC: 9 10*3/uL (ref 4.0–10.5)

## 2016-12-11 LAB — RENAL FUNCTION PANEL
Albumin: 2.9 g/dL — ABNORMAL LOW (ref 3.5–5.0)
Anion gap: 13 (ref 5–15)
BUN: 80 mg/dL — AB (ref 6–20)
CALCIUM: 8.4 mg/dL — AB (ref 8.9–10.3)
CHLORIDE: 96 mmol/L — AB (ref 101–111)
CO2: 31 mmol/L (ref 22–32)
CREATININE: 3.74 mg/dL — AB (ref 0.61–1.24)
GFR calc Af Amer: 17 mL/min — ABNORMAL LOW (ref >60)
GFR, EST NON AFRICAN AMERICAN: 15 mL/min — AB (ref >60)
Glucose, Bld: 89 mg/dL (ref 65–99)
Phosphorus: 5.5 mg/dL — ABNORMAL HIGH (ref 2.5–4.6)
Potassium: 3.2 mmol/L — ABNORMAL LOW (ref 3.5–5.1)
SODIUM: 140 mmol/L (ref 135–145)

## 2016-12-11 NOTE — Progress Notes (Signed)
[facility]  ROUNDING NOTE   Subjective:  Patient still having good urine output. However azotemia did worsen as BUN was up to 80 with a creatinine of 3.74. therefore patient did undergo hemodialysis today.   Objective:  Vital signs in last 24 hours:  Temperature 97.2 pulse 78 respirations 16 blood pressure 126/74 Physical Exam: General: No acute distress  Head: Normocephalic, atraumatic. Moist oral mucosal membranes  Eyes: Anicteric  Neck: Tracheostomy in place   Lungs:  Scattered rhonchi, unlabored breathing   Heart: S1S2 no rubs  Abdomen:  Soft, nontender, bowel sounds present  Extremities: 1+ peripheral edema.  Neurologic: Awake, alert, following commands  Skin: Scattered ecchymoses   Access: Right IJ temporary dialysis catheter     Basic Metabolic Panel:  Recent Labs Lab 12/06/16 0542 12/09/16 0544 12/09/16 2325 12/10/16 1514 12/11/16 0652  NA 135 137  --   --  140  K 3.0* 2.3* 2.5* 3.4* 3.2*  CL 100* 96*  --   --  96*  CO2 23 30  --   --  31  GLUCOSE 90 100*  --   --  89  BUN 58* 77*  --   --  80*  CREATININE 3.44* 3.91*  --   --  3.74*  CALCIUM 8.3* 8.3*  --   --  8.4*  PHOS  --   --   --   --  5.5*    Liver Function Tests:  Recent Labs Lab 12/11/16 0652  ALBUMIN 2.9*   No results for input(s): LIPASE, AMYLASE in the last 168 hours. No results for input(s): AMMONIA in the last 168 hours.  CBC:  Recent Labs Lab 12/06/16 1032 12/09/16 0544 12/11/16 0652  WBC 8.1 8.3 9.0  NEUTROABS 5.8  --   --   HGB 7.9* 8.2* 7.8*  HCT 25.2* 26.2* 24.8*  MCV 91.6 89.4 90.5  PLT 249 272 297    Cardiac Enzymes: No results for input(s): CKTOTAL, CKMB, CKMBINDEX, TROPONINI in the last 168 hours.  BNP: Invalid input(s): POCBNP  CBG: No results for input(s): GLUCAP in the last 168 hours.  Microbiology: Results for orders placed or performed during the hospital encounter of 11/28/16  C difficile quick scan w PCR reflex     Status: None   Collection Time: 12/05/16  8:45 AM  Result Value Ref Range Status   C Diff antigen NEGATIVE NEGATIVE Final   C Diff toxin NEGATIVE NEGATIVE Final   C Diff interpretation No C. difficile detected.  Final  Culture, respiratory (NON-Expectorated)     Status: None   Collection Time: 12/05/16  3:23 PM  Result Value Ref Range Status   Specimen Description TRACHEAL ASPIRATE  Final   Special Requests NONE  Final   Gram Stain   Final    RARE WBC PRESENT, PREDOMINANTLY MONONUCLEAR RARE BUDDING YEAST SEEN RARE GRAM NEGATIVE RODS    Culture   Final    FEW PSEUDOMONAS AERUGINOSA MULTI-DRUG RESISTANT ORGANISM RESULT CALLED TO, READ BACK BY AND VERIFIED WITH: S ODELL,RN AT 1015 12/07/16 BY L BENFIELD    Report Status 12/07/2016 FINAL  Final   Organism ID, Bacteria PSEUDOMONAS AERUGINOSA  Final      Susceptibility   Pseudomonas aeruginosa - MIC*    CEFTAZIDIME >=64 RESISTANT Resistant     CIPROFLOXACIN >=4 RESISTANT Resistant     GENTAMICIN 4 SENSITIVE Sensitive     IMIPENEM >=16 RESISTANT Resistant     CEFEPIME >=64 RESISTANT Resistant     *  FEW PSEUDOMONAS AERUGINOSA    Coagulation Studies: No results for input(s): LABPROT, INR in the last 72 hours.  Urinalysis: No results for input(s): COLORURINE, LABSPEC, PHURINE, GLUCOSEU, HGBUR, BILIRUBINUR, KETONESUR, PROTEINUR, UROBILINOGEN, NITRITE, LEUKOCYTESUR in the last 72 hours.  Invalid input(s): APPERANCEUR    Imaging: No results found.   Medications:       Assessment/ Plan:  71 y.o. male with a PMHx of Coronary artery disease status post recent 3 vessel CABG 10/15/16, tobacco abuse, carotid artery stenosis, hyperlipidemia, history of SVT, COPD, hypertension, abdominal aortic aneurysm who was admitted to [place]  on 11/28/2016 for ongoing treatment of acute renal failure, acute respiratory failure.  1. Acute renal failure, likely multifactorial.  CKD stage II  Baseline creatinine 1.1, EGFR 67.  As above suspect that the  patient's acute renal failure is multifactorial. He underwent recent CABG.  -  Azotemia did worsen slightly has BUN was up to 80. Therefore patient underwent hemodialysis today. Patient still has good urine output. Therefore we will continue the patient on Lasix drip.  2.  Acute respiratory failure.  Tracheostomy appears to be functioning well.  3. Anasarca/volume overload.  Continue Lasix drip at 6 g per hour.  4. Anemia of chronic kidney disease. Hemoglobin down a bit to 7.8. Continue to monitor closely. Consider blood transfusion for hemoglobin of 7 or less.   LOS: 0 Lachelle Rissler 7/25/20183:37 PM

## 2016-12-13 LAB — RENAL FUNCTION PANEL
Albumin: 2.8 g/dL — ABNORMAL LOW (ref 3.5–5.0)
Anion gap: 12 (ref 5–15)
BUN: 68 mg/dL — AB (ref 6–20)
CHLORIDE: 94 mmol/L — AB (ref 101–111)
CO2: 29 mmol/L (ref 22–32)
Calcium: 8.3 mg/dL — ABNORMAL LOW (ref 8.9–10.3)
Creatinine, Ser: 3.01 mg/dL — ABNORMAL HIGH (ref 0.61–1.24)
GFR calc Af Amer: 22 mL/min — ABNORMAL LOW (ref >60)
GFR calc non Af Amer: 19 mL/min — ABNORMAL LOW (ref >60)
GLUCOSE: 87 mg/dL (ref 65–99)
Phosphorus: 5.8 mg/dL — ABNORMAL HIGH (ref 2.5–4.6)
Potassium: 3.4 mmol/L — ABNORMAL LOW (ref 3.5–5.1)
SODIUM: 135 mmol/L (ref 135–145)

## 2016-12-13 LAB — CBC
HCT: 25.5 % — ABNORMAL LOW (ref 39.0–52.0)
Hemoglobin: 8.2 g/dL — ABNORMAL LOW (ref 13.0–17.0)
MCH: 28.5 pg (ref 26.0–34.0)
MCHC: 32.2 g/dL (ref 30.0–36.0)
MCV: 88.5 fL (ref 78.0–100.0)
PLATELETS: 354 10*3/uL (ref 150–400)
RBC: 2.88 MIL/uL — AB (ref 4.22–5.81)
RDW: 17.6 % — AB (ref 11.5–15.5)
WBC: 9.9 10*3/uL (ref 4.0–10.5)

## 2016-12-13 NOTE — Progress Notes (Signed)
[facility]  ROUNDING NOTE   Subjective:  Patient continues to have good urine output. Foley catheter has been removed. Patient currently eating breakfast this a.m. He will be due for dialysis later today.   Objective:  Vital signs in last 24 hours:  Temperature 96.6 pulse 74 respirations 21 blood pressure 128/64 Physical Exam: General: No acute distress  Head: Normocephalic, atraumatic. Moist oral mucosal membranes  Eyes: Anicteric  Neck: Tracheostomy in place   Lungs:  Scattered rhonchi, unlabored breathing   Heart: S1S2 no rubs  Abdomen:  Soft, nontender, bowel sounds present  Extremities: trace peripheral edema.  Neurologic: Awake, alert, following commands  Skin: Scattered ecchymoses   Access: Right IJ temporary dialysis catheter     Basic Metabolic Panel:  Recent Labs Lab 12/09/16 0544 12/09/16 2325 12/10/16 1514 12/11/16 0652 12/13/16 0447  NA 137  --   --  140 135  K 2.3* 2.5* 3.4* 3.2* 3.4*  CL 96*  --   --  96* 94*  CO2 30  --   --  31 29  GLUCOSE 100*  --   --  89 87  BUN 77*  --   --  80* 68*  CREATININE 3.91*  --   --  3.74* 3.01*  CALCIUM 8.3*  --   --  8.4* 8.3*  PHOS  --   --   --  5.5* 5.8*    Liver Function Tests:  Recent Labs Lab 12/11/16 0652 12/13/16 0447  ALBUMIN 2.9* 2.8*   No results for input(s): LIPASE, AMYLASE in the last 168 hours. No results for input(s): AMMONIA in the last 168 hours.  CBC:  Recent Labs Lab 12/06/16 1032 12/09/16 0544 12/11/16 0652 12/13/16 0447  WBC 8.1 8.3 9.0 9.9  NEUTROABS 5.8  --   --   --   HGB 7.9* 8.2* 7.8* 8.2*  HCT 25.2* 26.2* 24.8* 25.5*  MCV 91.6 89.4 90.5 88.5  PLT 249 272 297 354    Cardiac Enzymes: No results for input(s): CKTOTAL, CKMB, CKMBINDEX, TROPONINI in the last 168 hours.  BNP: Invalid input(s): POCBNP  CBG: No results for input(s): GLUCAP in the last 168 hours.  Microbiology: Results for orders placed or performed during the hospital encounter of  11/28/16  C difficile quick scan w PCR reflex     Status: None   Collection Time: 12/05/16  8:45 AM  Result Value Ref Range Status   C Diff antigen NEGATIVE NEGATIVE Final   C Diff toxin NEGATIVE NEGATIVE Final   C Diff interpretation No C. difficile detected.  Final  Culture, respiratory (NON-Expectorated)     Status: None   Collection Time: 12/05/16  3:23 PM  Result Value Ref Range Status   Specimen Description TRACHEAL ASPIRATE  Final   Special Requests NONE  Final   Gram Stain   Final    RARE WBC PRESENT, PREDOMINANTLY MONONUCLEAR RARE BUDDING YEAST SEEN RARE GRAM NEGATIVE RODS    Culture   Final    FEW PSEUDOMONAS AERUGINOSA MULTI-DRUG RESISTANT ORGANISM RESULT CALLED TO, READ BACK BY AND VERIFIED WITH: S ODELL,RN AT 1015 12/07/16 BY L BENFIELD    Report Status 12/07/2016 FINAL  Final   Organism ID, Bacteria PSEUDOMONAS AERUGINOSA  Final      Susceptibility   Pseudomonas aeruginosa - MIC*    CEFTAZIDIME >=64 RESISTANT Resistant     CIPROFLOXACIN >=4 RESISTANT Resistant     GENTAMICIN 4 SENSITIVE Sensitive     IMIPENEM >=16 RESISTANT Resistant  CEFEPIME >=64 RESISTANT Resistant     * FEW PSEUDOMONAS AERUGINOSA    Coagulation Studies: No results for input(s): LABPROT, INR in the last 72 hours.  Urinalysis: No results for input(s): COLORURINE, LABSPEC, PHURINE, GLUCOSEU, HGBUR, BILIRUBINUR, KETONESUR, PROTEINUR, UROBILINOGEN, NITRITE, LEUKOCYTESUR in the last 72 hours.  Invalid input(s): APPERANCEUR    Imaging: No results found.   Medications:       Assessment/ Plan:  71 y.o. male with a PMHx of Coronary artery disease status post recent 3 vessel CABG 10/15/16, tobacco abuse, carotid artery stenosis, hyperlipidemia, history of SVT, COPD, hypertension, abdominal aortic aneurysm who was admitted to [place]  on 11/28/2016 for ongoing treatment of acute renal failure, acute respiratory failure.  1. Acute renal failure, likely multifactorial.  CKD stage  II  Baseline creatinine 1.1, EGFR 67..  As above suspect that the patient's acute renal failure is multifactorial. He underwent recent CABG.  - Creatinine currently 3.01. BUN 68. We will plan for hemodialysis today as he still has some ongoing azotemia. Unclear as to whether he'll need further dialysis after that.   2.  Acute respiratory failure. Passy-Muir valve in place. Appears to be doing well.  3. Anasarca/volume overload.  Currently off of Lasix drip. His edema has significantly improved.  4. Anemia of chronic kidney disease. Hemoglobin currently 8.2. This will need continued monitoring.   LOS: 0 Shenay Torti 7/27/20188:48 AM

## 2016-12-14 LAB — POTASSIUM
POTASSIUM: 3.9 mmol/L (ref 3.5–5.1)
Potassium: 3.3 mmol/L — ABNORMAL LOW (ref 3.5–5.1)

## 2016-12-15 ENCOUNTER — Other Ambulatory Visit (HOSPITAL_COMMUNITY): Payer: Self-pay

## 2016-12-15 LAB — COMPREHENSIVE METABOLIC PANEL
ALBUMIN: 3.1 g/dL — AB (ref 3.5–5.0)
ALK PHOS: 80 U/L (ref 38–126)
ALT: 23 U/L (ref 17–63)
ANION GAP: 14 (ref 5–15)
AST: 32 U/L (ref 15–41)
BILIRUBIN TOTAL: 1 mg/dL (ref 0.3–1.2)
BUN: 43 mg/dL — ABNORMAL HIGH (ref 6–20)
CALCIUM: 8.9 mg/dL (ref 8.9–10.3)
CO2: 26 mmol/L (ref 22–32)
CREATININE: 2.29 mg/dL — AB (ref 0.61–1.24)
Chloride: 97 mmol/L — ABNORMAL LOW (ref 101–111)
GFR calc Af Amer: 31 mL/min — ABNORMAL LOW (ref >60)
GFR calc non Af Amer: 27 mL/min — ABNORMAL LOW (ref >60)
Glucose, Bld: 96 mg/dL (ref 65–99)
Potassium: 4.3 mmol/L (ref 3.5–5.1)
SODIUM: 137 mmol/L (ref 135–145)
Total Protein: 6.7 g/dL (ref 6.5–8.1)

## 2016-12-15 LAB — CBC WITH DIFFERENTIAL/PLATELET
BASOS PCT: 1 %
Basophils Absolute: 0.1 10*3/uL (ref 0.0–0.1)
EOS ABS: 0.3 10*3/uL (ref 0.0–0.7)
Eosinophils Relative: 3 %
HCT: 29.8 % — ABNORMAL LOW (ref 39.0–52.0)
HEMOGLOBIN: 9.3 g/dL — AB (ref 13.0–17.0)
Lymphocytes Relative: 10 %
Lymphs Abs: 1.1 10*3/uL (ref 0.7–4.0)
MCH: 28.2 pg (ref 26.0–34.0)
MCHC: 31.2 g/dL (ref 30.0–36.0)
MCV: 90.3 fL (ref 78.0–100.0)
Monocytes Absolute: 0.9 10*3/uL (ref 0.1–1.0)
Monocytes Relative: 8 %
NEUTROS PCT: 78 %
Neutro Abs: 8.9 10*3/uL — ABNORMAL HIGH (ref 1.7–7.7)
PLATELETS: 453 10*3/uL — AB (ref 150–400)
RBC: 3.3 MIL/uL — AB (ref 4.22–5.81)
RDW: 17.9 % — ABNORMAL HIGH (ref 11.5–15.5)
WBC: 11.2 10*3/uL — AB (ref 4.0–10.5)

## 2016-12-16 LAB — CBC
HEMATOCRIT: 27.6 % — AB (ref 39.0–52.0)
Hemoglobin: 8.7 g/dL — ABNORMAL LOW (ref 13.0–17.0)
MCH: 28.5 pg (ref 26.0–34.0)
MCHC: 31.5 g/dL (ref 30.0–36.0)
MCV: 90.5 fL (ref 78.0–100.0)
Platelets: 463 10*3/uL — ABNORMAL HIGH (ref 150–400)
RBC: 3.05 MIL/uL — ABNORMAL LOW (ref 4.22–5.81)
RDW: 18.2 % — AB (ref 11.5–15.5)
WBC: 10.7 10*3/uL — ABNORMAL HIGH (ref 4.0–10.5)

## 2016-12-16 LAB — BASIC METABOLIC PANEL
Anion gap: 11 (ref 5–15)
BUN: 48 mg/dL — AB (ref 6–20)
CALCIUM: 8.7 mg/dL — AB (ref 8.9–10.3)
CO2: 27 mmol/L (ref 22–32)
CREATININE: 2.29 mg/dL — AB (ref 0.61–1.24)
Chloride: 100 mmol/L — ABNORMAL LOW (ref 101–111)
GFR calc Af Amer: 31 mL/min — ABNORMAL LOW (ref >60)
GFR, EST NON AFRICAN AMERICAN: 27 mL/min — AB (ref >60)
GLUCOSE: 128 mg/dL — AB (ref 65–99)
Potassium: 4.1 mmol/L (ref 3.5–5.1)
Sodium: 138 mmol/L (ref 135–145)

## 2016-12-16 NOTE — Progress Notes (Signed)
[facility]  ROUNDING NOTE   Subjective:  Renal function appears to be improving. BUN down to 43 with a creatinine of 2.2. Good urine output noted.   Objective:  Vital signs in last 24 hours:  Temperature 99.4 pulse 94 respirations 26 blood pressure 134/89  Physical Exam: General: No acute distress  Head: Normocephalic, atraumatic. Moist oral mucosal membranes  Eyes: Anicteric  Neck: Tracheostomy in place   Lungs:  Scattered rhonchi, Normal effort   Heart: S1S2 no rubs  Abdomen:  Soft, nontender, bowel sounds present  Extremities: trace peripheral edema.  Neurologic: Awake, alert, following commands  Skin: Scattered ecchymoses   Access: Right IJ temporary dialysis catheter     Basic Metabolic Panel:  Recent Labs Lab 12/11/16 0652 12/13/16 0447 12/14/16 1138 12/14/16 2239 12/15/16 1247  NA 140 135  --   --  137  K 3.2* 3.4* 3.3* 3.9 4.3  CL 96* 94*  --   --  97*  CO2 31 29  --   --  26  GLUCOSE 89 87  --   --  96  BUN 80* 68*  --   --  43*  CREATININE 3.74* 3.01*  --   --  2.29*  CALCIUM 8.4* 8.3*  --   --  8.9  PHOS 5.5* 5.8*  --   --   --     Liver Function Tests:  Recent Labs Lab 12/11/16 0652 12/13/16 0447 12/15/16 1247  AST  --   --  32  ALT  --   --  23  ALKPHOS  --   --  80  BILITOT  --   --  1.0  PROT  --   --  6.7  ALBUMIN 2.9* 2.8* 3.1*   No results for input(s): LIPASE, AMYLASE in the last 168 hours. No results for input(s): AMMONIA in the last 168 hours.  CBC:  Recent Labs Lab 12/11/16 0652 12/13/16 0447 12/15/16 1247  WBC 9.0 9.9 11.2*  NEUTROABS  --   --  8.9*  HGB 7.8* 8.2* 9.3*  HCT 24.8* 25.5* 29.8*  MCV 90.5 88.5 90.3  PLT 297 354 453*    Cardiac Enzymes: No results for input(s): CKTOTAL, CKMB, CKMBINDEX, TROPONINI in the last 168 hours.  BNP: Invalid input(s): POCBNP  CBG: No results for input(s): GLUCAP in the last 168 hours.  Microbiology: Results for orders placed or performed during the  hospital encounter of 11/28/16  C difficile quick scan w PCR reflex     Status: None   Collection Time: 12/05/16  8:45 AM  Result Value Ref Range Status   C Diff antigen NEGATIVE NEGATIVE Final   C Diff toxin NEGATIVE NEGATIVE Final   C Diff interpretation No C. difficile detected.  Final  Culture, respiratory (NON-Expectorated)     Status: None   Collection Time: 12/05/16  3:23 PM  Result Value Ref Range Status   Specimen Description TRACHEAL ASPIRATE  Final   Special Requests NONE  Final   Gram Stain   Final    RARE WBC PRESENT, PREDOMINANTLY MONONUCLEAR RARE BUDDING YEAST SEEN RARE GRAM NEGATIVE RODS    Culture   Final    FEW PSEUDOMONAS AERUGINOSA MULTI-DRUG RESISTANT ORGANISM RESULT CALLED TO, READ BACK BY AND VERIFIED WITH: S ODELL,RN AT 1015 12/07/16 BY L BENFIELD    Report Status 12/07/2016 FINAL  Final   Organism ID, Bacteria PSEUDOMONAS AERUGINOSA  Final      Susceptibility   Pseudomonas aeruginosa - MIC*  CEFTAZIDIME >=64 RESISTANT Resistant     CIPROFLOXACIN >=4 RESISTANT Resistant     GENTAMICIN 4 SENSITIVE Sensitive     IMIPENEM >=16 RESISTANT Resistant     CEFEPIME >=64 RESISTANT Resistant     * FEW PSEUDOMONAS AERUGINOSA  Aerobic Culture (superficial specimen)     Status: None (Preliminary result)   Collection Time: 12/15/16  6:10 PM  Result Value Ref Range Status   Specimen Description WOUND STERNUM  Final   Special Requests NONE  Final   Gram Stain   Final    ABUNDANT WBC PRESENT, PREDOMINANTLY PMN RARE GRAM NEGATIVE RODS    Culture PENDING  Incomplete   Report Status PENDING  Incomplete    Coagulation Studies: No results for input(s): LABPROT, INR in the last 72 hours.  Urinalysis: No results for input(s): COLORURINE, LABSPEC, PHURINE, GLUCOSEU, HGBUR, BILIRUBINUR, KETONESUR, PROTEINUR, UROBILINOGEN, NITRITE, LEUKOCYTESUR in the last 72 hours.  Invalid input(s): APPERANCEUR    Imaging: Ct Chest Wo Contrast  Result Date: 12/15/2016 CLINICAL  DATA:  Possible wound infection. EXAM: CT CHEST WITHOUT CONTRAST TECHNIQUE: Multidetector CT imaging of the chest was performed following the standard protocol without IV contrast. COMPARISON:  Chest radiograph 12/03/2016 FINDINGS: Cardiovascular: Intact tracheostomy tube. Enlarged heart. Small pericardial effusion. Calcific atherosclerotic disease of the coronary arteries and aorta. Mediastinum/Nodes: No enlarged mediastinal or axillary lymph nodes. Thyroid gland, trachea, and esophagus demonstrate no significant findings. Post recent median sternotomy and CABG with foci of gas and soft tissue thickening in the retrosternal space, as well as anterior to the sternum. No drainable fluid collection. Pockets of gas within the right upper chest and within the right breast. Few isolated pockets of gas are also seen within the left upper chest. Lungs/Pleura: Bilateral pleural effusions, small, with bilateral lower lobe subsegmental atelectasis versus airspace consolidation. Right upper lobe subpleural 4 mm nodule, image 44/161, sequence 4. 3 mm ground-glass nodule in the anterior portion of the right upper lobe, image 74/161, sequence 4. Upper Abdomen: Gastrostomy tube coils on itself within the gastric body. Partially visualized nonobstructing 4 mm calculus within the midpole of the right kidney. Residual contrast within the colon. Musculoskeletal: No chest wall mass or suspicious bone lesions identified. Post median sternotomy. 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 IMPRESSION: Post median sternotomy with foci of gas and soft tissue thickening in the retrosternal space, as well as anterior to the sternum. No drainable fluid collection. Pockets of gas are seen within the right and left upper chest wall and right breast. Bilateral small pleural effusions. Bilateral lower lobe atelectasis versus airspace consolidation. Two less than 4 mm right lung subpleural nodules. No follow-up needed if patient is low-risk  (and has no known or suspected primary neoplasm). Non-contrast chest CT can be considered in 12 months if patient is high-risk. This recommendation follows the consensus statement: Guidelines for Management of Incidental Pulmonary Nodules Detected on CT Images: From the Fleischner Society 2017; Radiology 2017; 284:228-243. Calcific atherosclerotic disease of the coronary arteries and aorta. Aortic Atherosclerosis (ICD10-I70.0). Electronically Signed   By: Fidela Salisbury M.D.   On: 12/15/2016 17:07     Medications:       Assessment/ Plan:  71 y.o. male with a PMHx of Coronary artery disease status post recent 3 vessel CABG 10/15/16, tobacco abuse, carotid artery stenosis, hyperlipidemia, history of SVT, COPD, hypertension, abdominal aortic aneurysm who was admitted to [place]  on 11/28/2016 for ongoing treatment of acute renal failure, acute respiratory failure.  1. Acute renal failure,  likely multifactorial.  CKD stage II  Baseline creatinine 1.1, EGFR 67..  As above suspect that the patient's acute renal failure is multifactorial. He underwent recent CABG.  - BUN and creatinine continued to trend down. BUN currently 43 with a creatinine of 2.2. Good urine output noted. No need for dialysis at the moment. Consider removing temporary dialysis catheter on Wednesday if kidney function continues to improve.  2.  Acute respiratory failure. Patient continues to do well with Tracheostomy in place.  3. Anasarca/volume overload.  Overall improved. Good urine output noted.  4. Anemia of chronic kidney disease. Hemoglobin up to 9.3. Hold off on Epogen.  LOS: 0 Henry Parsons 7/30/20182:23 PM

## 2016-12-17 ENCOUNTER — Other Ambulatory Visit (HOSPITAL_COMMUNITY): Payer: Self-pay

## 2016-12-18 ENCOUNTER — Other Ambulatory Visit (HOSPITAL_COMMUNITY): Payer: Self-pay

## 2016-12-18 LAB — BASIC METABOLIC PANEL
ANION GAP: 12 (ref 5–15)
BUN: 58 mg/dL — AB (ref 6–20)
CALCIUM: 9 mg/dL (ref 8.9–10.3)
CO2: 29 mmol/L (ref 22–32)
CREATININE: 2.14 mg/dL — AB (ref 0.61–1.24)
Chloride: 97 mmol/L — ABNORMAL LOW (ref 101–111)
GFR, EST AFRICAN AMERICAN: 34 mL/min — AB (ref >60)
GFR, EST NON AFRICAN AMERICAN: 29 mL/min — AB (ref >60)
GLUCOSE: 80 mg/dL (ref 65–99)
Potassium: 3.5 mmol/L (ref 3.5–5.1)
Sodium: 138 mmol/L (ref 135–145)

## 2016-12-18 LAB — CBC
HCT: 25.5 % — ABNORMAL LOW (ref 39.0–52.0)
Hemoglobin: 7.9 g/dL — ABNORMAL LOW (ref 13.0–17.0)
MCH: 27.6 pg (ref 26.0–34.0)
MCHC: 31 g/dL (ref 30.0–36.0)
MCV: 89.2 fL (ref 78.0–100.0)
PLATELETS: 468 10*3/uL — AB (ref 150–400)
RBC: 2.86 MIL/uL — ABNORMAL LOW (ref 4.22–5.81)
RDW: 17.7 % — AB (ref 11.5–15.5)
WBC: 14.1 10*3/uL — AB (ref 4.0–10.5)

## 2016-12-18 NOTE — Progress Notes (Signed)
[facility]  ROUNDING NOTE   Subjective:  Pt seen at bedside.  Became a bit short of breath and started on lasix.   Cr down but BUN higher.    Objective:  Vital signs in last 24 hours:  Temperature 95.9  pulse 89 respirations 18 blood pressure 119/105  Physical Exam: General: No acute distress  Head: Normocephalic, atraumatic. Moist oral mucosal membranes  Eyes: Anicteric  Neck: Tracheostomy in place   Lungs:  Scattered rhonchi, Normal effort   Heart: S1S2 no rubs  Abdomen:  Soft, nontender, bowel sounds present  Extremities: trace peripheral edema.  Neurologic: Awake, alert, following commands  Skin: Scattered ecchymoses   Access: Right IJ temporary dialysis catheter     Basic Metabolic Panel:  Recent Labs Lab 12/13/16 0447 12/14/16 1138 12/14/16 2239 12/15/16 1247 12/16/16 1620 12/18/16 0542  NA 135  --   --  137 138 138  K 3.4* 3.3* 3.9 4.3 4.1 3.5  CL 94*  --   --  97* 100* 97*  CO2 29  --   --  _0 GLUCOSE 87  --   --  96 128* 80  BUN 68*  --   --  43* 48* 58*  CREATININE 3.01*  --   --  2.29* 2.29* 2.14*  CALCIUM 8.3*  --   --  8.9 8.7* 9.0  PHOS 5.8*  --   --   --   --   --     Liver Function Tests:  Recent Labs Lab 12/13/16 0447 12/15/16 1247  AST  --  32  ALT  --  23  ALKPHOS  --  80  BILITOT  --  1.0  PROT  --  6.7  ALBUMIN 2.8* 3.1*   No results for input(s): LIPASE, AMYLASE in the last 168 hours. No results for input(s): AMMONIA in the last 168 hours.  CBC:  Recent Labs Lab 12/13/16 0447 12/15/16 1247 12/16/16 1620 12/18/16 0542  WBC 9.9 11.2* 10.7* 14.1*  NEUTROABS  --  8.9*  --   --   HGB 8.2* 9.3* 8.7* 7.9*  HCT 25.5* 29.8* 27.6* 25.5*  MCV 88.5 90.3 90.5 89.2  PLT 354 453* 463* 468*    Cardiac Enzymes: No results for input(s): CKTOTAL, CKMB, CKMBINDEX, TROPONINI in the last 168 hours.  BNP: Invalid input(s): POCBNP  CBG: No results for input(s): GLUCAP in the last 168  hours.  Microbiology: Results for orders placed or performed during the hospital encounter of 11/28/16  C difficile quick scan w PCR reflex     Status: None   Collection Time: 12/05/16  8:45 AM  Result Value Ref Range Status   C Diff antigen NEGATIVE NEGATIVE Final   C Diff toxin NEGATIVE NEGATIVE Final   C Diff interpretation No C. difficile detected.  Final  Culture, respiratory (NON-Expectorated)     Status: None   Collection Time: 12/05/16  3:23 PM  Result Value Ref Range Status   Specimen Description TRACHEAL ASPIRATE  Final   Special Requests NONE  Final   Gram Stain   Final    RARE WBC PRESENT, PREDOMINANTLY MONONUCLEAR RARE BUDDING YEAST SEEN RARE GRAM NEGATIVE RODS    Culture   Final    FEW PSEUDOMONAS AERUGINOSA MULTI-DRUG RESISTANT ORGANISM RESULT CALLED TO, READ BACK BY AND VERIFIED WITH: S ODELL,RN AT 1015 12/07/16 BY L BENFIELD    Report Status 12/07/2016 FINAL  Final   Organism ID, Bacteria PSEUDOMONAS AERUGINOSA  Final  Susceptibility   Pseudomonas aeruginosa - MIC*    CEFTAZIDIME >=64 RESISTANT Resistant     CIPROFLOXACIN >=4 RESISTANT Resistant     GENTAMICIN 4 SENSITIVE Sensitive     IMIPENEM >=16 RESISTANT Resistant     CEFEPIME >=64 RESISTANT Resistant     * FEW PSEUDOMONAS AERUGINOSA  Aerobic Culture (superficial specimen)     Status: None (Preliminary result)   Collection Time: 12/15/16  6:10 PM  Result Value Ref Range Status   Specimen Description WOUND STERNUM  Final   Special Requests NONE  Final   Gram Stain   Final    ABUNDANT WBC PRESENT, PREDOMINANTLY PMN RARE GRAM NEGATIVE RODS    Culture FEW PSEUDOMONAS AERUGINOSA  Final   Report Status PENDING  Incomplete   Organism ID, Bacteria PSEUDOMONAS AERUGINOSA  Final      Susceptibility   Pseudomonas aeruginosa - MIC*    CEFTAZIDIME >=64 RESISTANT Resistant     CIPROFLOXACIN >=4 RESISTANT Resistant     GENTAMICIN <=1 SENSITIVE Sensitive     IMIPENEM >=16 RESISTANT Resistant     CEFEPIME  >=64 RESISTANT Resistant     * FEW PSEUDOMONAS AERUGINOSA    Coagulation Studies: No results for input(s): LABPROT, INR in the last 72 hours.  Urinalysis: No results for input(s): COLORURINE, LABSPEC, PHURINE, GLUCOSEU, HGBUR, BILIRUBINUR, KETONESUR, PROTEINUR, UROBILINOGEN, NITRITE, LEUKOCYTESUR in the last 72 hours.  Invalid input(s): APPERANCEUR    Imaging: Dg Chest Port 1 View  Result Date: 12/17/2016 CLINICAL DATA:  Respiratory failure EXAM: PORTABLE CHEST 1 VIEW COMPARISON:  Chest x-ray of December 03, 2016 and chest CT scan of July 29th 2018. FINDINGS: The lungs are well-expanded. The interstitial markings are coarse. There is persistent increased density at both bases with obscuration of the hemidiaphragms. There small bilateral pleural effusions. The cardiac silhouette is enlarged. The central pulmonary vascularity is engorged but less conspicuous today. There are post CABG changes. There is calcification in the wall of the aortic arch. The tracheostomy tube tip lies at the level of the clavicular heads. The right internal jugular venous catheter tip projects over the proximal SVC. IMPRESSION: CHF with pulmonary interstitial edema superimposed upon chronic bronchitic changes. Bibasilar densities compatible with atelectasis or pneumonia and small bilateral pleural effusions not greatly changed from the previous study. Thoracic aortic atherosclerosis. Electronically Signed   By: David  Martinique M.D.   On: 12/17/2016 14:56   Dg Abd Portable 1v  Result Date: 12/18/2016 CLINICAL DATA:  71 year old male with a prior history of small bowel obstruction. Recent history of median sternotomy EXAM: PORTABLE ABDOMEN - 1 VIEW COMPARISON:  CT chest 12/15/2016, abdominal plain film 11/28/2016 FINDINGS: Gas within stomach and small bowel. Gas throughout the length of the colon. No abnormal distention. Formed stool within right colon, transverse colon, and rectum. Gastrojejunostomy tube again noted, although the  jejunostomy limb has withdrawn into the stomach lumen. No unexpected radiopaque foreign body. No unexpected calcification or soft tissue density. IMPRESSION: Nonobstructive bowel gas pattern. Moderate to large stool burden, may reflect constipation. Percutaneous gastrojejunostomy tube, with the jejunostomy limb withdrawn into the stomach. Electronically Signed   By: Corrie Mckusick D.O.   On: 12/18/2016 15:06     Medications:       Assessment/ Plan:  71 y.o. male with a PMHx of Coronary artery disease status post recent 3 vessel CABG 10/15/16, tobacco abuse, carotid artery stenosis, hyperlipidemia, history of SVT, COPD, hypertension, abdominal aortic aneurysm who was admitted to [facility]  on 11/28/2016 for ongoing  treatment of acute renal failure, acute respiratory failure.  1. Acute renal failure, likely multifactorial.  CKD stage II  Baseline creatinine 1.1, EGFR 67..  As above suspect that the patient's acute renal failure is multifactorial. He underwent recent CABG.  - BUN slightly up, Cr lower, switch lasix to to PO.  D/C temporary dialysis catheter.   2.  Acute respiratory failure. Did have some shortness of breath, was restarted on lasix, will convert to PO.  3. Anasarca/volume overload.  Convert IV lasix to PO.   4. Anemia of chronic kidney disease. hgb currently 7.9, continue to monitor, consider blood transfusion if hgb continues to drop.   LOS: 0 Claiborne Stroble 8/1/20183:38 PM

## 2016-12-19 LAB — AEROBIC CULTURE W GRAM STAIN (SUPERFICIAL SPECIMEN)

## 2016-12-19 LAB — AEROBIC CULTURE  (SUPERFICIAL SPECIMEN)

## 2016-12-20 NOTE — Progress Notes (Signed)
No new renal function testing available today, will return on Monday to see patient.

## 2016-12-21 LAB — BASIC METABOLIC PANEL
ANION GAP: 11 (ref 5–15)
BUN: 55 mg/dL — ABNORMAL HIGH (ref 6–20)
CALCIUM: 9 mg/dL (ref 8.9–10.3)
CO2: 30 mmol/L (ref 22–32)
Chloride: 95 mmol/L — ABNORMAL LOW (ref 101–111)
Creatinine, Ser: 2.03 mg/dL — ABNORMAL HIGH (ref 0.61–1.24)
GFR calc non Af Amer: 31 mL/min — ABNORMAL LOW (ref >60)
GFR, EST AFRICAN AMERICAN: 36 mL/min — AB (ref >60)
Glucose, Bld: 97 mg/dL (ref 65–99)
Potassium: 3 mmol/L — ABNORMAL LOW (ref 3.5–5.1)
SODIUM: 136 mmol/L (ref 135–145)

## 2016-12-21 LAB — CBC
HCT: 21.1 % — ABNORMAL LOW (ref 39.0–52.0)
HEMOGLOBIN: 6.6 g/dL — AB (ref 13.0–17.0)
MCH: 28 pg (ref 26.0–34.0)
MCHC: 31.3 g/dL (ref 30.0–36.0)
MCV: 89.4 fL (ref 78.0–100.0)
PLATELETS: 368 10*3/uL (ref 150–400)
RBC: 2.36 MIL/uL — AB (ref 4.22–5.81)
RDW: 18 % — ABNORMAL HIGH (ref 11.5–15.5)
WBC: 9.5 10*3/uL (ref 4.0–10.5)

## 2016-12-23 NOTE — Progress Notes (Signed)
[facility]  ROUNDING NOTE   Subjective:   Wife at bedside.   Patient with some shortness of breath and pleuritic chest pain.   Getting IV albumin    Objective:  Vital signs in last 24 hours:  T 97.9 p 81 bp 115/60 SPO2 93%  Physical Exam: General: No acute distress  Head: Normocephalic, atraumatic. Moist oral mucosal membranes  Eyes: Anicteric  Neck: Tracheostomy    Lungs:  clear   Heart: S1S2 no rubs  Abdomen:  Soft, nontender, bowel sounds present  Extremities: peripheral edema.  Neurologic: Awake, alert, following commands  Skin: Scattered ecchymoses        Basic Metabolic Panel:  Recent Labs Lab 12/18/16 0542 12/21/16 0823  NA 138 136  K 3.5 3.0*  CL 97* 95*  CO2 29 30  GLUCOSE 80 97  BUN 58* 55*  CREATININE 2.14* 2.03*  CALCIUM 9.0 9.0    Liver Function Tests: No results for input(s): AST, ALT, ALKPHOS, BILITOT, PROT, ALBUMIN in the last 168 hours. No results for input(s): LIPASE, AMYLASE in the last 168 hours. No results for input(s): AMMONIA in the last 168 hours.  CBC:  Recent Labs Lab 12/18/16 0542 12/21/16 0823  WBC 14.1* 9.5  HGB 7.9* 6.6*  HCT 25.5* 21.1*  MCV 89.2 89.4  PLT 468* 368    Cardiac Enzymes: No results for input(s): CKTOTAL, CKMB, CKMBINDEX, TROPONINI in the last 168 hours.  BNP: Invalid input(s): POCBNP  CBG: No results for input(s): GLUCAP in the last 168 hours.  Microbiology: Results for orders placed or performed during the hospital encounter of 11/28/16  C difficile quick scan w PCR reflex     Status: None   Collection Time: 12/05/16  8:45 AM  Result Value Ref Range Status   C Diff antigen NEGATIVE NEGATIVE Final   C Diff toxin NEGATIVE NEGATIVE Final   C Diff interpretation No C. difficile detected.  Final  Culture, respiratory (NON-Expectorated)     Status: None   Collection Time: 12/05/16  3:23 PM  Result Value Ref Range Status   Specimen Description TRACHEAL ASPIRATE  Final   Special  Requests NONE  Final   Gram Stain   Final    RARE WBC PRESENT, PREDOMINANTLY MONONUCLEAR RARE BUDDING YEAST SEEN RARE GRAM NEGATIVE RODS    Culture   Final    FEW PSEUDOMONAS AERUGINOSA MULTI-DRUG RESISTANT ORGANISM RESULT CALLED TO, READ BACK BY AND VERIFIED WITH: S ODELL,RN AT 1015 12/07/16 BY L BENFIELD    Report Status 12/07/2016 FINAL  Final   Organism ID, Bacteria PSEUDOMONAS AERUGINOSA  Final      Susceptibility   Pseudomonas aeruginosa - MIC*    CEFTAZIDIME >=64 RESISTANT Resistant     CIPROFLOXACIN >=4 RESISTANT Resistant     GENTAMICIN 4 SENSITIVE Sensitive     IMIPENEM >=16 RESISTANT Resistant     CEFEPIME >=64 RESISTANT Resistant     * FEW PSEUDOMONAS AERUGINOSA  Aerobic Culture (superficial specimen)     Status: None   Collection Time: 12/15/16  6:10 PM  Result Value Ref Range Status   Specimen Description WOUND STERNUM  Final   Special Requests NONE  Final   Gram Stain   Final    ABUNDANT WBC PRESENT, PREDOMINANTLY PMN RARE GRAM NEGATIVE RODS    Culture   Final    FEW PSEUDOMONAS AERUGINOSA MULTI-DRUG RESISTANT ORGANISM    Report Status 12/19/2016 FINAL  Final   Organism ID, Bacteria PSEUDOMONAS AERUGINOSA  Final  Susceptibility   Pseudomonas aeruginosa - MIC*    CEFTAZIDIME >=64 RESISTANT Resistant     CIPROFLOXACIN >=4 RESISTANT Resistant     GENTAMICIN <=1 SENSITIVE Sensitive     IMIPENEM >=16 RESISTANT Resistant     CEFEPIME >=64 RESISTANT Resistant     * FEW PSEUDOMONAS AERUGINOSA    Coagulation Studies: No results for input(s): LABPROT, INR in the last 72 hours.  Urinalysis: No results for input(s): COLORURINE, LABSPEC, PHURINE, GLUCOSEU, HGBUR, BILIRUBINUR, KETONESUR, PROTEINUR, UROBILINOGEN, NITRITE, LEUKOCYTESUR in the last 72 hours.  Invalid input(s): APPERANCEUR    Imaging: No results found.   Medications:       Assessment/ Plan:  71 y.o. male with a PMHx of Coronary artery disease status post recent 3 vessel CABG 10/15/16,  tobacco abuse, carotid artery stenosis, hyperlipidemia, history of SVT, COPD, hypertension, abdominal aortic aneurysm who was admitted to [facility]  on 11/28/2016 for ongoing treatment of acute renal failure, acute respiratory failure.  1. Acute renal failure: acute renal failure is multifactorial - no indication for dialysis. Continue to monitor for daily need - Renally dose all medications  2.  Acute respiratory failure: doing well - prednisone - supportive care  3. Hypertension/Anasarca/volume overload.  Volume status acceptable - furosemide and spironolactone  4. Anemia of chronic kidney disease: hemoglobin 6.6 - pending new labs.  - iron supplements - Aranesp   LOS: 0 Henry Parsons 8/6/20184:55 PM

## 2016-12-24 LAB — BASIC METABOLIC PANEL
Anion gap: 14 (ref 5–15)
BUN: 44 mg/dL — ABNORMAL HIGH (ref 6–20)
CO2: 27 mmol/L (ref 22–32)
Calcium: 8.9 mg/dL (ref 8.9–10.3)
Chloride: 99 mmol/L — ABNORMAL LOW (ref 101–111)
Creatinine, Ser: 1.69 mg/dL — ABNORMAL HIGH (ref 0.61–1.24)
GFR calc Af Amer: 45 mL/min — ABNORMAL LOW (ref >60)
GFR calc non Af Amer: 38 mL/min — ABNORMAL LOW (ref >60)
GLUCOSE: 98 mg/dL (ref 65–99)
POTASSIUM: 3.5 mmol/L (ref 3.5–5.1)
Sodium: 140 mmol/L (ref 135–145)

## 2016-12-24 LAB — CBC WITH DIFFERENTIAL/PLATELET
Basophils Absolute: 0.1 10*3/uL (ref 0.0–0.1)
Basophils Relative: 1 %
Eosinophils Absolute: 0.5 10*3/uL (ref 0.0–0.7)
Eosinophils Relative: 4 %
HEMATOCRIT: 22.9 % — AB (ref 39.0–52.0)
Hemoglobin: 7 g/dL — ABNORMAL LOW (ref 13.0–17.0)
LYMPHS ABS: 2.3 10*3/uL (ref 0.7–4.0)
LYMPHS PCT: 16 %
MCH: 27.3 pg (ref 26.0–34.0)
MCHC: 30.6 g/dL (ref 30.0–36.0)
MCV: 89.5 fL (ref 78.0–100.0)
MONO ABS: 1.6 10*3/uL — AB (ref 0.1–1.0)
MONOS PCT: 11 %
NEUTROS ABS: 10.1 10*3/uL — AB (ref 1.7–7.7)
Neutrophils Relative %: 68 %
Platelets: 334 10*3/uL (ref 150–400)
RBC: 2.56 MIL/uL — ABNORMAL LOW (ref 4.22–5.81)
RDW: 18.3 % — AB (ref 11.5–15.5)
WBC: 14.6 10*3/uL — ABNORMAL HIGH (ref 4.0–10.5)

## 2016-12-25 LAB — RENAL FUNCTION PANEL
ALBUMIN: 3.2 g/dL — AB (ref 3.5–5.0)
Anion gap: 12 (ref 5–15)
BUN: 43 mg/dL — AB (ref 6–20)
CALCIUM: 9.1 mg/dL (ref 8.9–10.3)
CHLORIDE: 98 mmol/L — AB (ref 101–111)
CO2: 27 mmol/L (ref 22–32)
CREATININE: 1.62 mg/dL — AB (ref 0.61–1.24)
GFR, EST AFRICAN AMERICAN: 47 mL/min — AB (ref >60)
GFR, EST NON AFRICAN AMERICAN: 40 mL/min — AB (ref >60)
Glucose, Bld: 91 mg/dL (ref 65–99)
Phosphorus: 4.4 mg/dL (ref 2.5–4.6)
Potassium: 3.6 mmol/L (ref 3.5–5.1)
SODIUM: 137 mmol/L (ref 135–145)

## 2016-12-25 LAB — CBC
HCT: 24 % — ABNORMAL LOW (ref 39.0–52.0)
Hemoglobin: 7.4 g/dL — ABNORMAL LOW (ref 13.0–17.0)
MCH: 27.4 pg (ref 26.0–34.0)
MCHC: 30.8 g/dL (ref 30.0–36.0)
MCV: 88.9 fL (ref 78.0–100.0)
PLATELETS: 343 10*3/uL (ref 150–400)
RBC: 2.7 MIL/uL — AB (ref 4.22–5.81)
RDW: 18.5 % — AB (ref 11.5–15.5)
WBC: 11.9 10*3/uL — AB (ref 4.0–10.5)

## 2016-12-26 LAB — RENAL FUNCTION PANEL
ALBUMIN: 3.3 g/dL — AB (ref 3.5–5.0)
Anion gap: 12 (ref 5–15)
BUN: 45 mg/dL — ABNORMAL HIGH (ref 6–20)
CO2: 28 mmol/L (ref 22–32)
CREATININE: 1.66 mg/dL — AB (ref 0.61–1.24)
Calcium: 9.2 mg/dL (ref 8.9–10.3)
Chloride: 98 mmol/L — ABNORMAL LOW (ref 101–111)
GFR calc Af Amer: 46 mL/min — ABNORMAL LOW (ref >60)
GFR, EST NON AFRICAN AMERICAN: 39 mL/min — AB (ref >60)
GLUCOSE: 88 mg/dL (ref 65–99)
Phosphorus: 4.4 mg/dL (ref 2.5–4.6)
Potassium: 4.2 mmol/L (ref 3.5–5.1)
SODIUM: 138 mmol/L (ref 135–145)

## 2016-12-26 LAB — CBC
HCT: 23.8 % — ABNORMAL LOW (ref 39.0–52.0)
HEMOGLOBIN: 7.5 g/dL — AB (ref 13.0–17.0)
MCH: 28.3 pg (ref 26.0–34.0)
MCHC: 31.5 g/dL (ref 30.0–36.0)
MCV: 89.8 fL (ref 78.0–100.0)
PLATELETS: 319 10*3/uL (ref 150–400)
RBC: 2.65 MIL/uL — AB (ref 4.22–5.81)
RDW: 18.7 % — ABNORMAL HIGH (ref 11.5–15.5)
WBC: 10.8 10*3/uL — ABNORMAL HIGH (ref 4.0–10.5)

## 2017-06-20 DEATH — deceased

## 2018-01-28 IMAGING — CT CT CHEST W/O CM
2 of 4 series · 14 of 36 positions shown, 17 images · non-contrast
Comparison: Chest radiograph 12/03/2016

CLINICAL DATA: Possible wound infection.

EXAM:
CT CHEST WITHOUT CONTRAST
TECHNIQUE: Multidetector CT imaging of the chest was performed following the
standard protocol without IV contrast.

[Series 3: chest wo · axial · 0.77mm/px · z∈[+1350,+1620]mm · 11 of 161 slices shown, 14 images]
[im 13/161  mediastinal]
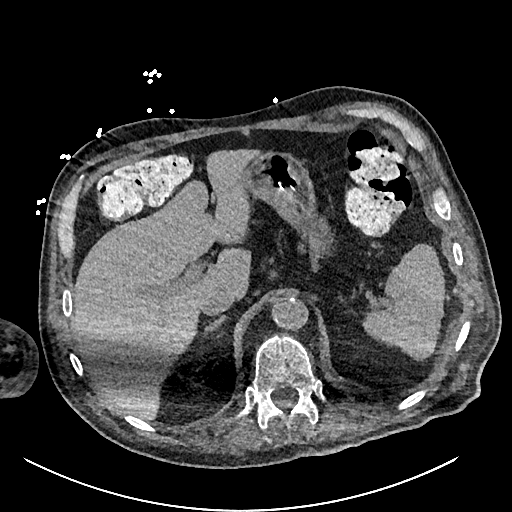
[im 13/161  lung]
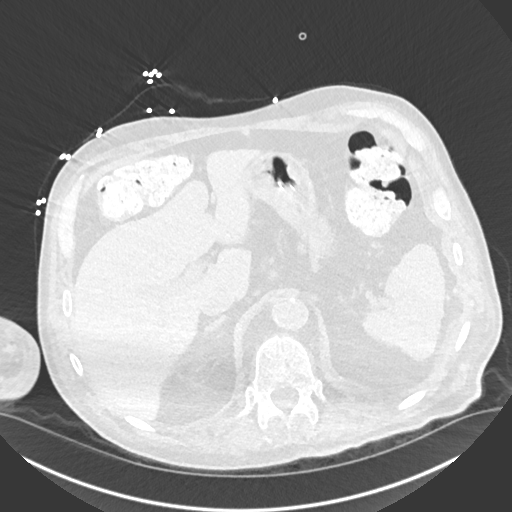
[im 25/161  lung]
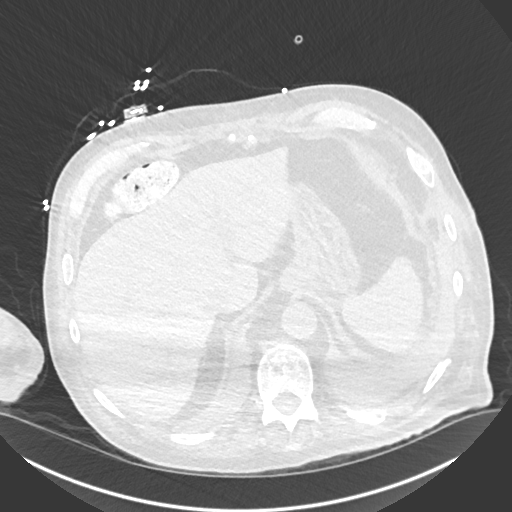
[im 37/161  lung]
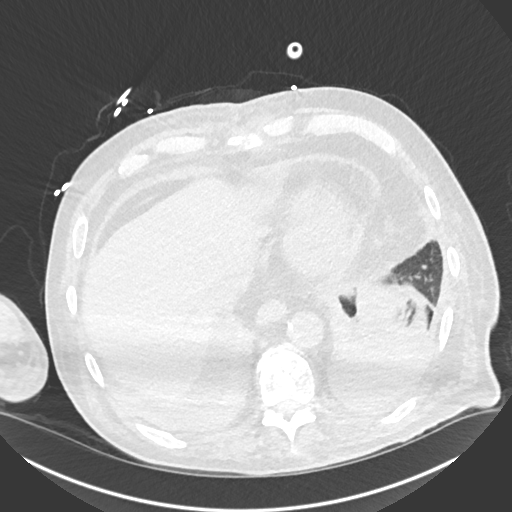
[im 50/161  lung]
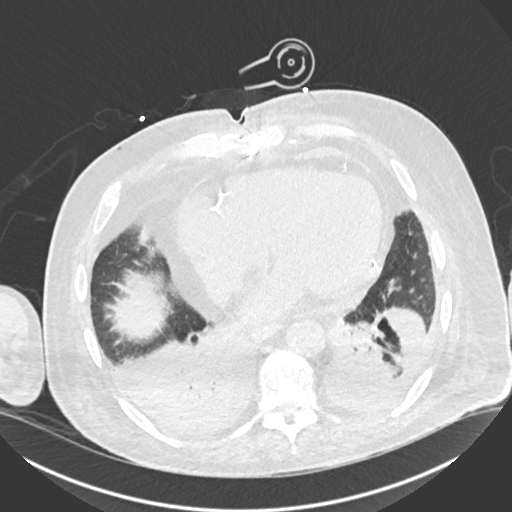
[im 62/161  mediastinal]
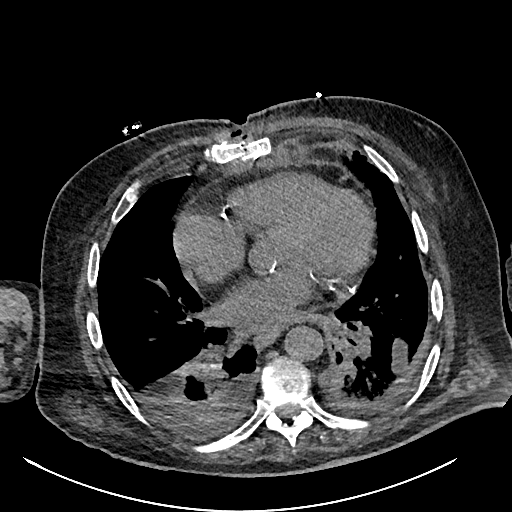
[im 62/161  lung]
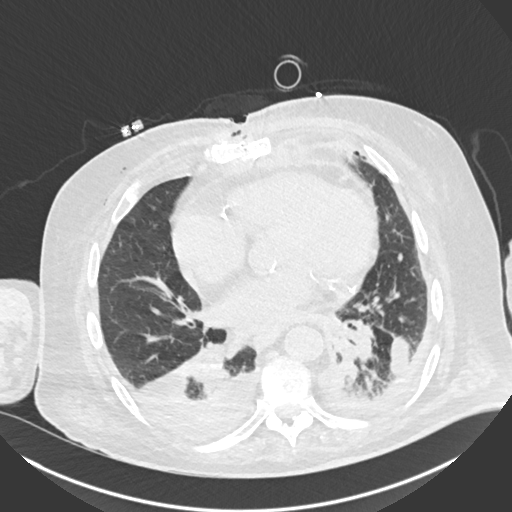
[im 87/161  lung]
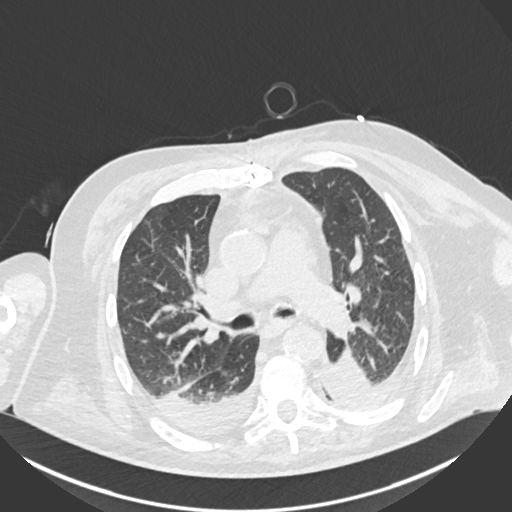
[im 99/161  lung]
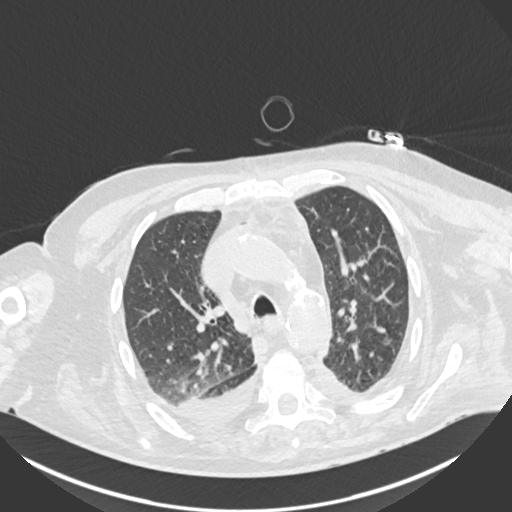
[im 111/161  lung]
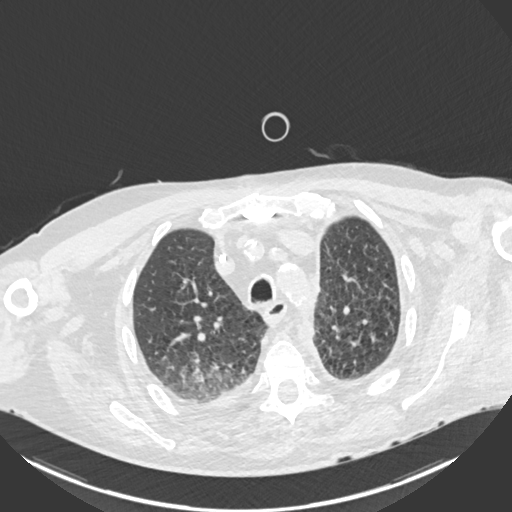
[im 124/161  mediastinal]
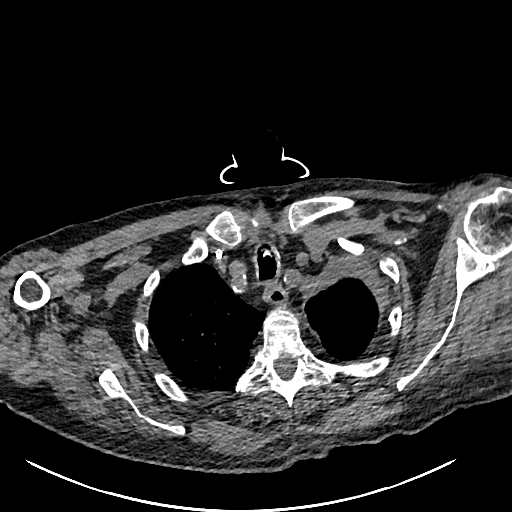
[im 124/161  lung]
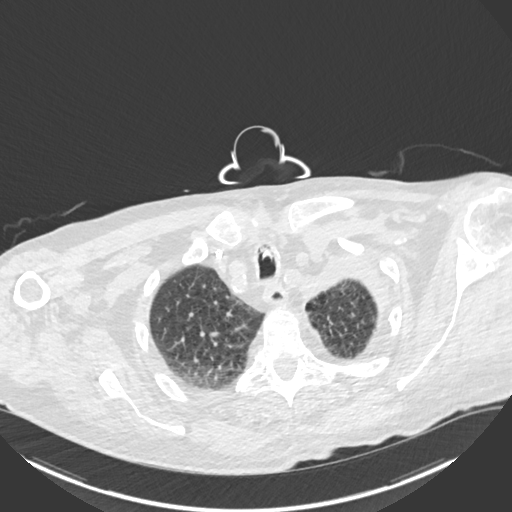
[im 136/161  lung]
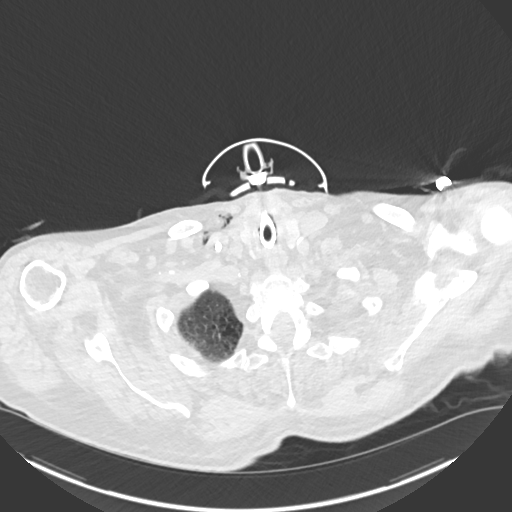
[im 148/161  lung]
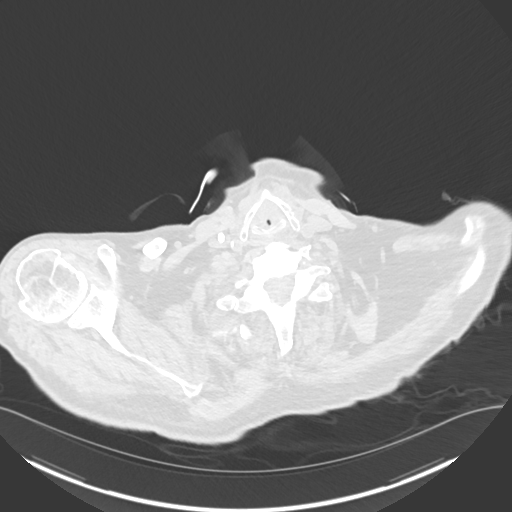

[Series 6: cor · coronal · 0.64mm/px · 3 of 172 slices shown]
[im 35/172  lung]
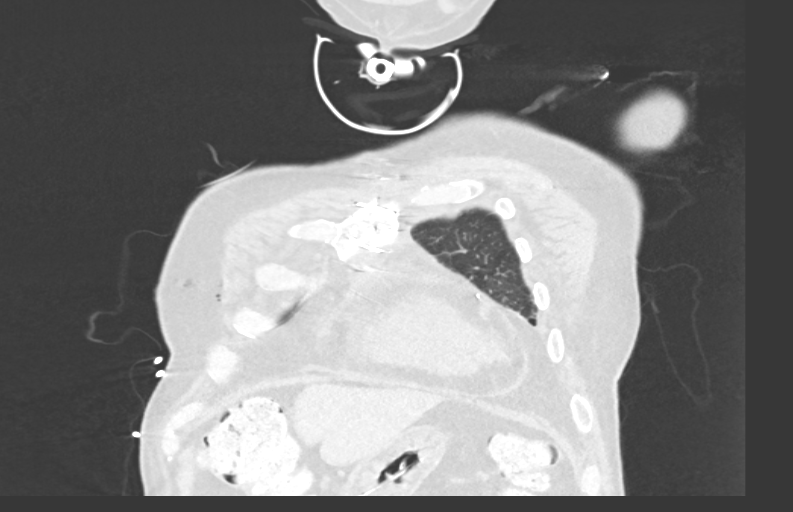
[im 69/172  lung]
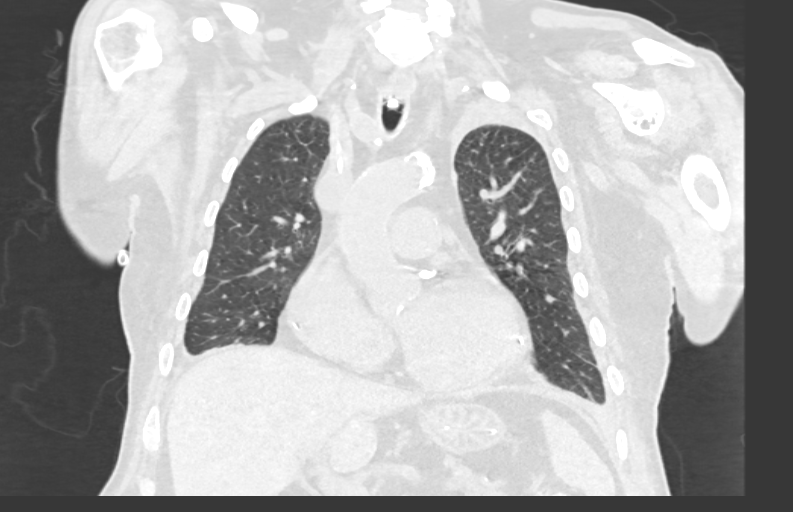
[im 103/172  lung]
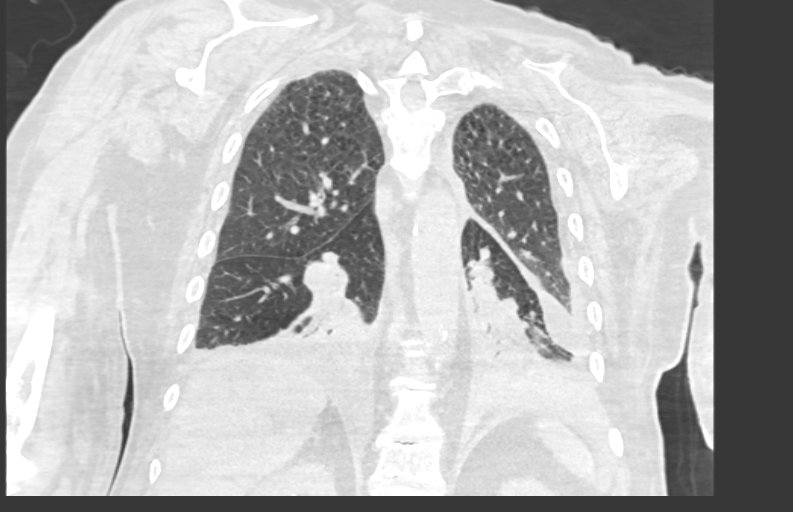

[14 of 36 positions shown; findings below may reference images not displayed]

FINDINGS: Cardiovascular: Intact tracheostomy tube. Enlarged heart. Small
pericardial effusion. Calcific atherosclerotic disease of the
coronary arteries and aorta.

Mediastinum/Nodes: No enlarged mediastinal or axillary lymph nodes.
Thyroid gland, trachea, and esophagus demonstrate no significant
findings. Post recent median sternotomy and CABG with foci of gas
and soft tissue thickening in the retrosternal space, as well as
anterior to the sternum. No drainable fluid collection. Pockets of
gas within the right upper chest and within the right breast. Few
isolated pockets of gas are also seen within the left upper chest.

Lungs/Pleura: Bilateral pleural effusions, small, with bilateral
lower lobe subsegmental atelectasis versus airspace consolidation.
Right upper lobe subpleural 4 mm nodule, image 44/161, sequence 4. 3
mm ground-glass nodule in the anterior portion of the right upper
lobe, image 74/161, sequence 4.

Upper Abdomen: Gastrostomy tube coils on itself within the gastric
body. Partially visualized nonobstructing 4 mm calculus within the
midpole of the right kidney. Residual contrast within the colon.

Musculoskeletal: No chest wall mass or suspicious bone lesions
identified. Post median sternotomy. Osteoarthritic changes of the
lower thoracic spine.
IMPRESSION: Post median sternotomy with foci of gas and soft tissue thickening
in the retrosternal space, as well as anterior to the sternum. No
drainable fluid collection.

Pockets of gas are seen within the right and left upper chest wall
and right breast.

Bilateral small pleural effusions.

Bilateral lower lobe atelectasis versus airspace consolidation.

Two less than 4 mm right lung subpleural nodules. No follow-up
needed if patient is low-risk (and has no known or suspected primary
neoplasm). Non-contrast chest CT can be considered in 12 months if
patient is high-risk. This recommendation follows the consensus
statement: Guidelines for Management of Incidental Pulmonary Nodules
Detected on CT Images: From the [HOSPITAL] 1277; Radiology
1277; [DATE].

Calcific atherosclerotic disease of the coronary arteries and aorta.

Aortic Atherosclerosis (IJDSD-QX0.0).
